# Patient Record
Sex: Female | Born: 1935 | Race: White | Hispanic: No | Marital: Married
Health system: Southern US, Community
[De-identification: ages and names within clinical notes are randomized; demographics above are authoritative.]

---

## 2004-08-02 ENCOUNTER — Ambulatory Visit: Payer: Self-pay | Admitting: Internal Medicine

## 2005-03-11 ENCOUNTER — Ambulatory Visit: Payer: Self-pay | Admitting: Internal Medicine

## 2006-02-20 ENCOUNTER — Ambulatory Visit: Payer: Self-pay | Admitting: Internal Medicine

## 2007-04-19 ENCOUNTER — Ambulatory Visit: Payer: Self-pay | Admitting: Family Medicine

## 2008-07-24 ENCOUNTER — Ambulatory Visit: Payer: Self-pay | Admitting: Internal Medicine

## 2009-03-04 ENCOUNTER — Ambulatory Visit: Payer: Self-pay | Admitting: Internal Medicine

## 2009-03-22 ENCOUNTER — Inpatient Hospital Stay: Payer: Self-pay | Admitting: Internal Medicine

## 2009-04-04 ENCOUNTER — Ambulatory Visit: Payer: Self-pay | Admitting: Internal Medicine

## 2009-10-27 ENCOUNTER — Observation Stay: Payer: Self-pay | Admitting: Internal Medicine

## 2010-01-07 ENCOUNTER — Ambulatory Visit: Payer: Self-pay | Admitting: Internal Medicine

## 2011-02-01 ENCOUNTER — Ambulatory Visit: Payer: Self-pay | Admitting: Internal Medicine

## 2011-11-14 ENCOUNTER — Ambulatory Visit: Payer: Self-pay | Admitting: Unknown Physician Specialty

## 2012-04-17 ENCOUNTER — Ambulatory Visit: Payer: Self-pay | Admitting: Internal Medicine

## 2012-05-28 ENCOUNTER — Ambulatory Visit: Payer: Self-pay | Admitting: Internal Medicine

## 2012-11-10 ENCOUNTER — Other Ambulatory Visit: Payer: Self-pay | Admitting: Unknown Physician Specialty

## 2012-11-10 LAB — CLOSTRIDIUM DIFFICILE BY PCR

## 2012-11-12 LAB — STOOL CULTURE

## 2012-11-22 ENCOUNTER — Other Ambulatory Visit: Payer: Self-pay | Admitting: Unknown Physician Specialty

## 2012-11-22 LAB — CLOSTRIDIUM DIFFICILE BY PCR

## 2012-11-25 LAB — STOOL CULTURE

## 2013-02-14 ENCOUNTER — Emergency Department: Payer: Self-pay | Admitting: Emergency Medicine

## 2013-02-14 LAB — COMPREHENSIVE METABOLIC PANEL
Albumin: 3.3 g/dL — ABNORMAL LOW (ref 3.4–5.0)
Bilirubin,Total: 0.4 mg/dL (ref 0.2–1.0)
Calcium, Total: 9.1 mg/dL (ref 8.5–10.1)
Creatinine: 0.89 mg/dL (ref 0.60–1.30)
EGFR (African American): >60
EGFR (Non-African Amer.): >60
Glucose: 125 mg/dL — ABNORMAL HIGH (ref 65–99)
SGOT(AST): 14 U/L — ABNORMAL LOW (ref 15–37)
Total Protein: 6.2 g/dL — ABNORMAL LOW (ref 6.4–8.2)

## 2013-02-14 LAB — CBC
HCT: 35.9 % (ref 35.0–47.0)
HGB: 12.3 g/dL (ref 12.0–16.0)
MCH: 30.2 pg (ref 26.0–34.0)
MCV: 88 fL (ref 80–100)

## 2013-02-14 LAB — CK TOTAL AND CKMB (NOT AT ARMC)
CK, Total: 53 U/L (ref 21–215)
CK-MB: 1.1 ng/mL (ref 0.5–3.6)

## 2013-02-14 LAB — TROPONIN I: Troponin-I: <0.02 ng/mL

## 2013-06-05 ENCOUNTER — Ambulatory Visit: Payer: Self-pay | Admitting: Unknown Physician Specialty

## 2013-06-10 ENCOUNTER — Ambulatory Visit: Payer: Self-pay | Admitting: Unknown Physician Specialty

## 2013-06-14 LAB — PATHOLOGY REPORT

## 2013-09-24 ENCOUNTER — Other Ambulatory Visit: Payer: Self-pay | Admitting: Unknown Physician Specialty

## 2013-09-24 LAB — CLOSTRIDIUM DIFFICILE(ARMC)

## 2013-09-26 LAB — STOOL CULTURE

## 2013-10-02 ENCOUNTER — Other Ambulatory Visit: Payer: Self-pay | Admitting: Unknown Physician Specialty

## 2013-10-02 LAB — CLOSTRIDIUM DIFFICILE(ARMC)

## 2013-10-04 LAB — WBCS, STOOL

## 2013-10-04 LAB — STOOL CULTURE

## 2013-10-10 ENCOUNTER — Ambulatory Visit: Payer: Self-pay | Admitting: Unknown Physician Specialty

## 2013-10-14 LAB — PATHOLOGY REPORT

## 2014-02-04 ENCOUNTER — Ambulatory Visit: Payer: Self-pay | Admitting: Neurology

## 2014-06-03 ENCOUNTER — Ambulatory Visit: Payer: Self-pay | Admitting: Internal Medicine

## 2014-06-13 ENCOUNTER — Ambulatory Visit: Payer: Self-pay | Admitting: Internal Medicine

## 2014-06-19 ENCOUNTER — Inpatient Hospital Stay: Payer: Self-pay | Admitting: Internal Medicine

## 2014-06-20 DIAGNOSIS — I441 Atrioventricular block, second degree: Secondary | ICD-10-CM

## 2014-06-24 ENCOUNTER — Encounter
Admit: 2014-06-24 | Disposition: A | Discharge status: Home / Self Care | Payer: Self-pay | Attending: Internal Medicine | Admitting: Internal Medicine

## 2014-06-24 ENCOUNTER — Ambulatory Visit: Admit: 2014-06-24 | Disposition: A | Discharge status: Home / Self Care | Payer: Self-pay | Admitting: Neurology

## 2014-06-26 LAB — CBC WITH DIFFERENTIAL/PLATELET
BASOS PCT: 0.2 %
Basophil #: 0 10*3/uL (ref 0.0–0.1)
EOS PCT: 1.4 %
Eosinophil #: 0.1 10*3/uL (ref 0.0–0.7)
HCT: 25.6 % — ABNORMAL LOW (ref 35.0–47.0)
HGB: 8.5 g/dL — AB (ref 12.0–16.0)
Lymphocyte #: 1 10*3/uL (ref 1.0–3.6)
Lymphocyte %: 12 %
MCH: 29.1 pg (ref 26.0–34.0)
MCHC: 33 g/dL (ref 32.0–36.0)
MCV: 88 fL (ref 80–100)
Monocyte #: 0.5 x10 3/mm (ref 0.2–0.9)
Monocyte %: 5.9 %
NEUTROS ABS: 6.7 10*3/uL — AB (ref 1.4–6.5)
Neutrophil %: 80.5 %
PLATELETS: 361 10*3/uL (ref 150–440)
RBC: 2.91 10*6/uL — ABNORMAL LOW (ref 3.80–5.20)
RDW: 14 % (ref 11.5–14.5)
WBC: 8.3 10*3/uL (ref 3.6–11.0)

## 2014-07-01 LAB — CBC WITH DIFFERENTIAL/PLATELET
Basophil #: 0 10*3/uL (ref 0.0–0.1)
Basophil %: 0.6 %
Eosinophil #: 0.1 10*3/uL (ref 0.0–0.7)
Eosinophil %: 1.5 %
HCT: 24.1 % — AB (ref 35.0–47.0)
HGB: 7.8 g/dL — ABNORMAL LOW (ref 12.0–16.0)
LYMPHS ABS: 1.2 10*3/uL (ref 1.0–3.6)
Lymphocyte %: 16.9 %
MCH: 28.9 pg (ref 26.0–34.0)
MCHC: 32.4 g/dL (ref 32.0–36.0)
MCV: 89 fL (ref 80–100)
MONO ABS: 0.7 x10 3/mm (ref 0.2–0.9)
Monocyte %: 10.2 %
NEUTROS ABS: 4.8 10*3/uL (ref 1.4–6.5)
Neutrophil %: 70.8 %
Platelet: 489 10*3/uL — ABNORMAL HIGH (ref 150–440)
RBC: 2.7 10*6/uL — ABNORMAL LOW (ref 3.80–5.20)
RDW: 14.5 % (ref 11.5–14.5)
WBC: 6.8 10*3/uL (ref 3.6–11.0)

## 2014-07-01 LAB — BASIC METABOLIC PANEL
Anion Gap: 6 — ABNORMAL LOW (ref 7–16)
BUN: 16 mg/dL
CHLORIDE: 101 mmol/L
CO2: 27 mmol/L
Calcium, Total: 8.5 mg/dL — ABNORMAL LOW
Creatinine: 0.58 mg/dL
EGFR (African American): >60
GLUCOSE: 100 mg/dL — AB
Potassium: 4.1 mmol/L
Sodium: 134 mmol/L — ABNORMAL LOW

## 2014-07-04 ENCOUNTER — Encounter
Admit: 2014-07-04 | Disposition: A | Discharge status: Home / Self Care | Payer: Self-pay | Attending: Internal Medicine | Admitting: Internal Medicine

## 2014-07-04 LAB — COMPREHENSIVE METABOLIC PANEL
ALBUMIN: 3.2 g/dL — AB
ALK PHOS: 129 U/L — AB
ALT: 20 U/L
Anion Gap: 8 (ref 7–16)
BUN: 22 mg/dL — ABNORMAL HIGH
Bilirubin,Total: 0.6 mg/dL
CO2: 26 mmol/L
CREATININE: 0.6 mg/dL
Calcium, Total: 9.2 mg/dL
Chloride: 101 mmol/L
Glucose: 104 mg/dL — ABNORMAL HIGH
POTASSIUM: 3.9 mmol/L
SGOT(AST): 20 U/L
SODIUM: 135 mmol/L
Total Protein: 6 g/dL — ABNORMAL LOW

## 2014-07-04 LAB — CBC WITH DIFFERENTIAL/PLATELET
BASOS PCT: 0.6 %
Basophil #: 0 10*3/uL (ref 0.0–0.1)
EOS PCT: 1.7 %
Eosinophil #: 0.1 10*3/uL (ref 0.0–0.7)
HCT: 28.9 % — ABNORMAL LOW (ref 35.0–47.0)
HGB: 9.3 g/dL — AB (ref 12.0–16.0)
LYMPHS PCT: 13 %
Lymphocyte #: 1 10*3/uL (ref 1.0–3.6)
MCH: 29 pg (ref 26.0–34.0)
MCHC: 32.2 g/dL (ref 32.0–36.0)
MCV: 90 fL (ref 80–100)
MONOS PCT: 8.2 %
Monocyte #: 0.7 x10 3/mm (ref 0.2–0.9)
NEUTROS PCT: 76.5 %
Neutrophil #: 6.1 10*3/uL (ref 1.4–6.5)
Platelet: 562 10*3/uL — ABNORMAL HIGH (ref 150–440)
RBC: 3.21 10*6/uL — AB (ref 3.80–5.20)
RDW: 15.2 % — ABNORMAL HIGH (ref 11.5–14.5)
WBC: 8 10*3/uL (ref 3.6–11.0)

## 2014-07-05 LAB — URINALYSIS, COMPLETE
BACTERIA: NONE SEEN
BLOOD: NEGATIVE
Bilirubin,UR: NEGATIVE
GLUCOSE, UR: NEGATIVE mg/dL (ref 0–75)
Ketone: NEGATIVE
Leukocyte Esterase: NEGATIVE
Nitrite: NEGATIVE
PROTEIN: NEGATIVE
Ph: 5 (ref 4.5–8.0)
RBC,UR: 1 /HPF (ref 0–5)
SPECIFIC GRAVITY: 1.014 (ref 1.003–1.030)
Squamous Epithelial: 1

## 2014-07-07 LAB — URINE CULTURE

## 2014-07-12 LAB — URINALYSIS, COMPLETE
BILIRUBIN, UR: NEGATIVE
Blood: NEGATIVE
Glucose,UR: NEGATIVE mg/dL (ref 0–75)
KETONE: NEGATIVE
Nitrite: POSITIVE
PROTEIN: NEGATIVE
Ph: 6 (ref 4.5–8.0)
SPECIFIC GRAVITY: 1.012 (ref 1.003–1.030)

## 2014-07-14 LAB — CBC WITH DIFFERENTIAL/PLATELET
BASOS ABS: 0 10*3/uL (ref 0.0–0.1)
Basophil %: 0.8 %
Eosinophil #: 0 10*3/uL (ref 0.0–0.7)
Eosinophil %: 0.9 %
HCT: 31.3 % — AB (ref 35.0–47.0)
HGB: 10.6 g/dL — ABNORMAL LOW (ref 12.0–16.0)
LYMPHS PCT: 21.6 %
Lymphocyte #: 1.2 10*3/uL (ref 1.0–3.6)
MCH: 30.3 pg (ref 26.0–34.0)
MCHC: 33.8 g/dL (ref 32.0–36.0)
MCV: 90 fL (ref 80–100)
Monocyte #: 0.7 x10 3/mm (ref 0.2–0.9)
Monocyte %: 12.5 %
Neutrophil #: 3.4 10*3/uL (ref 1.4–6.5)
Neutrophil %: 64.2 %
Platelet: 332 10*3/uL (ref 150–440)
RBC: 3.49 10*6/uL — ABNORMAL LOW (ref 3.80–5.20)
RDW: 15.5 % — ABNORMAL HIGH (ref 11.5–14.5)
WBC: 5.3 10*3/uL (ref 3.6–11.0)

## 2014-07-14 LAB — COMPREHENSIVE METABOLIC PANEL
AST: 20 U/L
Albumin: 3.4 g/dL — ABNORMAL LOW
Alkaline Phosphatase: 119 U/L
Anion Gap: 7 (ref 7–16)
BILIRUBIN TOTAL: 0.4 mg/dL
BUN: 19 mg/dL
CREATININE: 0.51 mg/dL
Calcium, Total: 9 mg/dL
Chloride: 103 mmol/L
Co2: 25 mmol/L
EGFR (Non-African Amer.): >60
GLUCOSE: 108 mg/dL — AB
Potassium: 2.8 mmol/L — ABNORMAL LOW
SGPT (ALT): 16 U/L
SODIUM: 135 mmol/L
TOTAL PROTEIN: 5.9 g/dL — AB

## 2014-07-14 LAB — URINE CULTURE

## 2014-07-14 LAB — MAGNESIUM: MAGNESIUM: 1.6 mg/dL — AB

## 2014-07-14 LAB — TSH: Thyroid Stimulating Horm: 2.039 u[IU]/mL

## 2014-07-18 LAB — COMPREHENSIVE METABOLIC PANEL
ALK PHOS: 109 U/L
ANION GAP: 3 — AB (ref 7–16)
Albumin: 3.7 g/dL
BILIRUBIN TOTAL: 0.5 mg/dL
BUN: 17 mg/dL
CREATININE: 0.53 mg/dL
Calcium, Total: 9.4 mg/dL
Chloride: 110 mmol/L
Co2: 25 mmol/L
EGFR (African American): >60
EGFR (Non-African Amer.): >60
GLUCOSE: 99 mg/dL
Potassium: 4.3 mmol/L
SGOT(AST): 25 U/L
SGPT (ALT): 19 U/L
SODIUM: 138 mmol/L
TOTAL PROTEIN: 6.2 g/dL — AB

## 2014-07-18 LAB — CBC WITH DIFFERENTIAL/PLATELET
BASOS ABS: 0 10*3/uL (ref 0.0–0.1)
Basophil %: 0.4 %
Eosinophil #: 0 10*3/uL (ref 0.0–0.7)
Eosinophil %: 0.4 %
HCT: 32.8 % — ABNORMAL LOW (ref 35.0–47.0)
HGB: 10.8 g/dL — AB (ref 12.0–16.0)
LYMPHS PCT: 16.2 %
Lymphocyte #: 1.1 10*3/uL (ref 1.0–3.6)
MCH: 29.8 pg (ref 26.0–34.0)
MCHC: 32.8 g/dL (ref 32.0–36.0)
MCV: 91 fL (ref 80–100)
Monocyte #: 0.6 x10 3/mm (ref 0.2–0.9)
Monocyte %: 8.6 %
NEUTROS PCT: 74.4 %
Neutrophil #: 5 10*3/uL (ref 1.4–6.5)
Platelet: 306 10*3/uL (ref 150–440)
RBC: 3.61 10*6/uL — ABNORMAL LOW (ref 3.80–5.20)
RDW: 15.9 % — AB (ref 11.5–14.5)
WBC: 6.7 10*3/uL (ref 3.6–11.0)

## 2014-07-21 LAB — COMPREHENSIVE METABOLIC PANEL
ALBUMIN: 3 g/dL — AB
ANION GAP: 6 — AB (ref 7–16)
Alkaline Phosphatase: 85 U/L
BUN: 13 mg/dL
Bilirubin,Total: 0.4 mg/dL
Calcium, Total: 9 mg/dL
Chloride: 109 mmol/L
Co2: 26 mmol/L
Creatinine: 0.42 mg/dL — ABNORMAL LOW
EGFR (African American): >60
EGFR (Non-African Amer.): >60
Glucose: 91 mg/dL
POTASSIUM: 3.4 mmol/L — AB
SGOT(AST): 17 U/L
SGPT (ALT): 14 U/L
Sodium: 141 mmol/L
TOTAL PROTEIN: 5.2 g/dL — AB

## 2014-07-21 LAB — CBC WITH DIFFERENTIAL/PLATELET
Basophil #: 0 10*3/uL (ref 0.0–0.1)
Basophil %: 0.7 %
Eosinophil #: 0.2 10*3/uL (ref 0.0–0.7)
Eosinophil %: 3.1 %
HCT: 31.3 % — ABNORMAL LOW (ref 35.0–47.0)
HGB: 10.5 g/dL — AB (ref 12.0–16.0)
LYMPHS ABS: 1.6 10*3/uL (ref 1.0–3.6)
Lymphocyte %: 22.7 %
MCH: 29.9 pg (ref 26.0–34.0)
MCHC: 33.4 g/dL (ref 32.0–36.0)
MCV: 90 fL (ref 80–100)
MONOS PCT: 8.8 %
Monocyte #: 0.6 x10 3/mm (ref 0.2–0.9)
NEUTROS ABS: 4.5 10*3/uL (ref 1.4–6.5)
Neutrophil %: 64.7 %
Platelet: 258 10*3/uL (ref 150–440)
RBC: 3.5 10*6/uL — ABNORMAL LOW (ref 3.80–5.20)
RDW: 15.5 % — ABNORMAL HIGH (ref 11.5–14.5)
WBC: 6.9 10*3/uL (ref 3.6–11.0)

## 2014-08-03 ENCOUNTER — Encounter
Admission: RE | Admit: 2014-08-03 | Discharge: 2014-08-03 | Disposition: A | Discharge status: Home / Self Care | Payer: 59 | Source: Ambulatory Visit | Attending: Internal Medicine | Admitting: Internal Medicine

## 2014-08-03 NOTE — Consult Note (Signed)
PATIENT NAME:  Julie Wiley, Julie Wiley MR#:  [id number] DATE OF BIRTH:  May 19, 1932  DATE OF CONSULTATION:  06/23/2014  CONSULTING PHYSICIAN: Audery Amel, MD  IDENTIFYING INFORMATION AND REASON FOR CONSULTATION: A 81 year old woman currently in the hospital after a leg fracture. Consult for depression and weight loss.   HISTORY OF PRESENT ILLNESS: Information from the patient and the patient's family and the chart. This is reconsult. The patient was seen by Dr. Guss Bunde over the weekend. Family requested a repeat evaluation. This 81 year old woman is currently in the hospital after suffering a leg fracture from a fall. Family reports psychiatrically that she has had gradual worsening of her cognition that has taken place over months. She has been diagnosed with dementia and treated with dementia-retarding agents outside the hospital. She has a history of being diagnosed with depression as well and has been treated with antidepressants by her primary care doctor. Family is chiefly concerned, however, about her major weight loss. The patient is able to describe that she has lost between 70-80 pounds over the past several months. She said that she just does not want to eat anymore. Family reports that she has been gradually eating smaller and smaller volumes. She says that she sleeps poorly at night. She admits that her mood feels bad much of the time. She says that she occasionally has thoughts about wanting to just "give up," but denies that she would ever do anything to try and harm herself. Asked about hallucinations, she talks about a bad dream, but does not report any clear hallucinations during the day. The patient indicates that she feels like a major stress in her life is discord within her family which she does not go into detail about. She tells me that thing that she is most looking forward to in life is that there would be peace in her family, which again she does not go into detail about.   PAST  PSYCHIATRIC HISTORY: No history of suicide attempts. No history of psychiatric hospitalization. Has been treated with Zoloft by her primary care doctor, originally at 50 mg, now at 100. Family knows of no other antidepressant use in the past.   SOCIAL HISTORY: Lives with her husband. Sounds like he does most of the monitoring of her medication. Has extended family in the area who are involved in her life.   PAST MEDICAL HISTORY: The patient currently in the hospital because of a fall. History of high blood pressure. History of major weight loss.   FAMILY HISTORY: Father committed suicide.   CURRENT MEDICATIONS: Here in the hospital getting Levaquin IV for infection. Zoloft 50 mg a day now. Dr. Katrinka Blazing started her on Sinemet 25 mg 3 times a day. Risperdal 0.5 mg twice a day, which is currently being held, aspirin 81 mg a day, Percocet 5 mg 1-2 every 6 hours as needed for pain, trazodone 25 mg at night, pantoprazole 40 mg a day, mirabegron extended release 25 mg once a day, Namenda 5 mg daily, Cozaar 50 mg daily, amlodipine 5 mg twice a day.   ALLERGIES: CODEINE.   REVIEW OF SYSTEMS: The patient continues to report no appetite. Down, negative mood. No suicidal ideation actively. Passive hopelessness.   MENTAL STATUS EXAMINATION: Chronic and acutely ill woman interviewed in a hospital room. Eye contact was poor and minimal. Psychomotor activity very slow. Speech was slow and decreased in amount and quiet. Affect flat at times, dysphoric and sad, almost tearful. Mood stated as being not so good.  Thoughts are a little bit scattered and slow. Denies hallucinations. Denies suicidal intent, but has some hopelessness. The patient can repeat 3 words immediately with some effort, remembers none of them at 3 minutes. She was disoriented as to her current situation [place]. Judgment and insight impaired, cognitive processing impaired.   LABORATORY RESULTS AND  IMAGING: Brain MRI today was read as nearly normal. No acute finding. The patient is anemic with a hemoglobin and hematocrit both very low. Magnesium normal. Sodium low. Glucose has been elevated. Multiple laboratories done over the last couple days.   VITAL SIGNS: Blood pressure 103/63, respirations 18, pulse 48, temperature 99.   ASSESSMENT: A 81 year old woman who is malnourished. Has been losing a large amount of weight. So far, no medical cause other than lack of appetite. The patient denies suicidal intent, but has multiple signs and symptoms consistent with major depression. Without at all disputing the fact that she has some dementia, I think this patient has a major depression as well. Zoloft has clearly not been enough to treat it.  TREATMENT PLAN: The case discussed with the family and the patient. I proposed switching the Zoloft over to a different antidepressant. The husband is quite adamant that he does not want her to take anything that could be sedating for her. He tells a story about how her trazodone dose, even at a small dose of 50 mg, was terribly oversedating for her. Therefore, I will not recommend Remeron, but instead recommend a switch over to Lexapro, cut the dose of Zoloft in half, start 5 mg a day of Lexapro. The patient will need psychiatric followup after discharge. I am concerned about the possibility that she will continue to starve herself gradually and worsen her health.   DIAGNOSIS, PRINCIPAL AND PRIMARY:  AXIS I: Major depression, severe, recurrent.   SECONDARY DIAGNOSES: AXIS I: Dementia, mixed type, possibly frontal-type.  AXIS II: Deferred.  AXIS III: Acute fracture, malnutrition, now being diagnosed and treated for parkinsonism.     ____________________________ Audery AmelJohn T. Luke Rigsbee, MD jtc:LT D: 06/23/2014 19:49:46 ET T: 06/23/2014 20:25:16 ET JOB#: [id number]  cc: Audery AmelJohn T. Cloyde Oregel, MD, <Dictator> Audery AmelJOHN T Jenaveve Fenstermaker MD ELECTRONICALLY SIGNED 07/07/2014 9:57

## 2014-08-03 NOTE — Consult Note (Signed)
Brief Consult Note: Diagnosis: Non displaced left lateral tibial plateau and fibular neck fractures.   Patient was seen by consultant.   Recommend further assessment or treatment.   Orders entered.   Comments: 81 year old female fell in garage last night injuring the left knee and leg.  Brought to Emergency Room where exam and X-rays show non displaced fractures of the lateral tibial plateau and fibular neck.  Very osteoporotic as well.  History of cabg and 83 lb weight loss in last year.  Admitted for pain control and mobilization.    Exam:  Sleepy but arousable.  circulation/sensation/motor function good both lower legs with some bruising. Left knee swollen but not red or warm.  range of motion decreased by pain and swelling.  some increased valgus due to severe arthritis.  Hip range of motion good.   X-rays: as above.  Hip and femur negative as is left elbow and ankle. No tibial shaft fracture noted. Marked osteopenia.   Imp:  as above  Rx: Ace, ice, knee immobilizer.       touch down weight bearing only       Will need skilled nursing facility        No surgery contemplated as fractures are non displaced.  Electronic Signatures: Valinda HoarMiller, Angellee Cohill E (MD)  (Signed 18-Mar-16 13:55)  Authored: Brief Consult Note   Last Updated: 18-Mar-16 13:55 by Valinda HoarMiller, Aniceto Kyser E (MD)

## 2014-08-03 NOTE — Consult Note (Signed)
Chief Complaint:  Subjective/Chief Complaint Lethargic and sleepy this morning from a night after relative insomnia confusion and delirium denies any pain.   VITAL SIGNS/ANCILLARY NOTES: **Vital Signs.:   21-Mar-16 07:42  Vital Signs Type Routine  Temperature Temperature (F) 98  Celsius 36.6  Temperature Source oral  Pulse Pulse 50  Respirations Respirations 19  Systolic BP Systolic BP 144  Diastolic BP (mmHg) Diastolic BP (mmHg) 67  Mean BP 95  Pulse Ox % Pulse Ox % 95  Pulse Ox Activity Level  At rest  Oxygen Delivery Room Air/ 21 %  *Intake and Output.:   Daily 21-Mar-16 07:00  Grand Totals Intake:  1334 Output:      Net:  1334 24 Hr.:  8185  Oral Intake      In:  240  IV (Primary)      In:  1094  Urine ml     Out:  0  Length of Stay Totals Intake:  2454 Output:      Net:  6314   Brief Assessment:  GEN well developed, well nourished, no acute distress   Cardiac Regular  murmur present  -- LE edema  -- JVD  --Gallop   Respiratory normal resp effort  clear BS   Gastrointestinal Normal   Gastrointestinal details normal Soft  Nontender  Nondistended   EXTR negative cyanosis/clubbing, negative edema, knee in traction   Lab Results: Routine Chem:  21-Mar-16 03:41   Glucose, Serum  105 (65-99 NOTE: New Reference Range  05/27/14)  BUN 11 (6-20 NOTE: New Reference Range  05/27/14)  Creatinine (comp) 0.50 (0.44-1.00 NOTE: New Reference Range  05/27/14)  Sodium, Serum  129 (135-145 NOTE: New Reference Range  05/27/14)  Potassium, Serum 3.6 (3.5-5.1 NOTE: New Reference Range  05/27/14)  Chloride, Serum  97 (101-111 NOTE: New Reference Range  05/27/14)  CO2, Serum 25 (22-32 NOTE: New Reference Range  05/27/14)  Calcium (Total), Serum  8.0 (8.9-10.3 NOTE: New Reference Range  05/27/14)  Anion Gap 7  eGFR (African American) >60  eGFR (Non-African American) >60 (eGFR values <64m/min/1.73 m2 may be an indication of chronic kidney disease  (CKD). Calculated eGFR is useful in patients with stable renal function. The eGFR calculation will not be reliable in acutely ill patients when serum creatinine is changing rapidly. It is not useful in patients on dialysis. The eGFR calculation may not be applicable to patients at the low and high extremes of body sizes, pregnant women, and vegetarians.)  Routine Hem:  21-Mar-16 03:41   WBC (CBC) 9.6  RBC (CBC)  2.88  Hemoglobin (CBC)  8.5  Hematocrit (CBC)  25.2  Platelet Count (CBC) 228  MCV 88  MCH 29.3  MCHC 33.5  RDW 13.8  Neutrophil % 84.5  Lymphocyte % 5.9  Monocyte % 9.4  Eosinophil % 0.1  Basophil % 0.1  Neutrophil #  8.1  Lymphocyte #  0.6  Monocyte # 0.9  Eosinophil # 0.0  Basophil # 0.0 (Result(s) reported on 23 Jun 2014 at 04:48AM.)   Radiology Results: XRay:    17-Mar-16 20:45, Femur Left  Femur Left   REASON FOR EXAM:    fall  COMMENTS:       PROCEDURE: DXR - DXR FEMUR LEFT  - Jun 19 2014  8:45PM     CLINICAL DATA:  Left leg pain following fall, initial encounter    EXAM:  LEFT FEMUR - 2 VIEW    COMPARISON:  None.    FINDINGS:  There fractures  noted in the proximal tibia and fibula which are  incompletely evaluated on this exam. The femur appears intact. The  visualized portions of the pelvic ring are unremarkable. A large  knee joint effusion is noted with fat fluid level consistent with  thefractures in the proximal tibia.     IMPRESSION:  No acute femoral fracture is noted. There fractures of the proximal  tibia and fibula with an associated joint effusion of the knee with  a fat fluid level.      Electronically Signed    By: Inez Catalina M.D.    On: 06/19/2014 20:51       Verified By: Everlene Farrier, M.D.,    17-Mar-16 21:00, Ankle Left Complete  Ankle Left Complete   REASON FOR EXAM:    fall  COMMENTS:       PROCEDURE: DXR - DXR ANKLE LEFT COMPLETE  - Jun 19 2014  9:00PM     CLINICAL DATA:  Left leg pain status post  fall.    EXAM:  LEFT ANKLE COMPLETE - 3+ VIEW    COMPARISON:  None.    FINDINGS:  There is generalized osteopenia. There is no evidence of fracture,  dislocation, or joint effusion. There is no evidence of arthropathy  or other focal bone abnormality. Soft tissues are unremarkable.     IMPRESSION:  No acute osseous injury of the left ankle.      Electronically Signed    By: Kathreen Devoid    On: 06/19/2014 21:02         Verified By: Jennette Banker, M.D., MD    17-Mar-16 21:00, Elbow Right Complete  Elbow Right Complete   REASON FOR EXAM:    fall  COMMENTS:       PROCEDURE: DXR - DXR ELBOW RT COMP W/OBLIQUES  - Jun 19 2014  9:00PM     CLINICAL DATA:  Left leg pain status post fall. Right elbow pain  status post fall.    EXAM:  RIGHT ELBOW - COMPLETE 3+ VIEW    COMPARISON:  None.    FINDINGS:  There is generalized osteopenia. There is no evidence of fracture,  dislocation, or joint effusion. There is no evidence of arthropathy  or other focal bone abnormality. Soft tissues are unremarkable.     IMPRESSION:  No acute osseous injury of the right elbow.      Electronically Signed    By: Kathreen Devoid    On: 06/19/2014 21:05         Verified By: Jennette Banker, M.D., MD    17-Mar-16 21:00, Pelvis AP Only  Pelvis AP Only   REASON FOR EXAM:    fall, left leg pain  COMMENTS:       PROCEDURE: DXR - DXR PELVIS AP ONLY  - Jun 19 2014  9:00PM     CLINICAL DATA:  Left leg pain status post fall.  Initial encounter.    EXAM:  PELVIS - 1-2 VIEW    COMPARISON:  None.    FINDINGS:  There is no evidence of fracture or dislocation. Both femoral heads  are seated normally within their respective acetabula. Mild  degenerative change is noted along the lower lumbar spine. The  patient is status post vertebroplasty at L2. The sacroiliac joints  are unremarkable in appearance.    The visualized bowel gas pattern is grossly unremarkable in  appearance. Scattered  diverticulosis is noted along the proximal  sigmoid colon. A clip is seen  overlying the left inguinal region.     IMPRESSION:  No evidence of fracture or dislocation.      Electronically Signed    By: Garald Balding M.D.    On: 06/19/2014 21:11     Verified By: JEFFREY . CHANG, M.D.,    17-Mar-16 21:00, Tibia And Fibula Left  Tibia And Fibula Left   REASON FOR EXAM:    fall  COMMENTS:       PROCEDURE: DXR - DXR TIBIA AND FIBULA LT (LOWER L  - Jun 19 2014  9:00PM     CLINICAL DATA:  Left leg pain status post fall.    EXAM:  LEFT TIBIA AND FIBULA - 2 VIEW    COMPARISON:  None.    FINDINGS:  Nondisplaced proximal fibular metaphysis fracture. Nondisplaced  lateral tibial plateau fracture with a fracture cleft extending into  the proximal diaphysis. Large knee joint effusion. Possible  nondisplaced fracture of the inferior pole of the patella.     IMPRESSION:  1.  Nondisplaced proximal fibular metaphysis fracture.  2. Nondisplaced lateral tibial plateau fracture with a fracture  cleft extending into the proximal diaphysis.  3. Possible nondisplaced fracture of the inferior pole of the  patella.  4. Large knee joint effusion.      Electronically Signed    By: Kathreen Devoid    On: 06/19/2014 21:07     Verified By: Jennette Banker, M.D., MD    19-Mar-16 23:13, Chest PA and Lateral  Chest PA and Lateral   REASON FOR EXAM:    AMS  COMMENTS:       PROCEDURE: DXR - DXR CHEST PA (OR AP) AND LATERAL  - Jun 21 2014 11:13PM     CLINICAL DATA:  Altered mental status.    EXAM:  CHEST  2 VIEW    COMPARISON:  10/27/2009    FINDINGS:  There is posterior left lower lobe consolidation. The right lung is  clear. There is moderate unchanged cardiomegaly and mild aortic  tortuosity. Pulmonary vasculature is normal.     IMPRESSION:  Left lower lobe consolidation, infectious or atelectatic. Unchanged  cardiomegaly.      Electronically Signed    By: Andreas Newport M.D.     On: 06/21/2014 23:16         Verified By: Andreas Newport, M.D.,  Cardiology:    17-Mar-16 19:33, ED ECG  Ventricular Rate 54  Atrial Rate 104  P-R Interval 190  QRS Duration 126  QT 494  QTc 468  P Axis 80  R Axis 29  T Axis 10  ECG interpretation   Sinus tachycardia with 2nd degree A-V block (Mobitz II) with 2:1 A-V conduction  Right bundle branch block  Abnormal ECG  When compared with ECG of 10-Oct-2013 17:10,  Sinus rhythm is now with 2nd degree A-V block (Mobitz II)  ----------unconfirmed----------  Confirmed by OVERREAD, NOT ([id number]), editor PEARSON, BARBARA ([id number]) on 06/20/2014 1:23:02 PM  ED ECG     18-Mar-16 09:01, ECG  Ventricular Rate 51  Atrial Rate 51  P-R Interval 172  QRS Duration 124  QT 572  QTc 527  P Axis 82  R Axis 41  T Axis 47  ECG interpretation   Sinus bradycardia  Right bundle branch block  Abnormal ECG  When compared with ECG of 19-Jun-2014 19:33,  No significant change was found  Confirmed by Rockey Situ, TIMOTHY ([id number]) on 06/20/2014 2:43:55 PM    Overreader: Ida Rogue  ECG  18-Mar-16 10:28, Echo Doppler  Echo Doppler   PRELIMINARY REPORT    The following is a PRELIMINARY Radiology report.  A final report will follow pending radiologist verification.      REASON FOR EXAM:      COMMENTS:       PROCEDURE: [facility] - ECHO DOPPLER COMPLETE(TRANSTHOR)  - Jun 20 2014 10:28AM     RESULT: Echocardiogram Report    Patient Name:   MERAL GEISSINGER Date of Exam: 06/20/2014  Medical Rec #:  [id number]             Custom1:  Date of Birth:  14-Nov-1932          Height:       64.0 in  Patient Age:    81 years           Weight:       111.0 lb  Patient Gender: F                  BSA:          1.52 m??    Indications: Syncope,pre-op clearance  Sonographer:    Sherrie Sport RDCS  Referring Phys: Lance Coon, F    Summary:   1. Left ventricular ejection fraction, by visual estimation, is 70 to   75%.   2. Normal global left ventricular systolic  function.   3. Mildly elevated pulmonary artery systolic pressure.   4. Mild to moderate tricuspid regurgitation.  2D AND M-MODE MEASUREMENTS (normal ranges within parentheses):  Left Ventricle:          Normal  IVSd (2D):      0.74 cm (0.7-1.1)  LVPWd (2D):     1.07 cm (0.7-1.1) Aorta/LA:                  Normal  LVIDd (2D):     4.72 cm (3.4-5.7) Aortic Root (2D): 3.60 cm (2.4-3.7)  LVIDs (2D):     2.66 cm        Left Atrium (2D): 4.20 cm (1.9-4.0)  LV FS (2D):     43.6 %   (>25%)  LV EF (2D):     74.8 %   (>50%)                                    Right Ventricle:                                    RVd (2D):        4.85 cm  LV DIASTOLIC FUNCTION:  MV Peak E: 1.17 m/s E/e' Ratio: 4.90  MV Peak A: 0.56 m/s Decel Time: 144 msec  E/A Ratio: 2.10  SPECTRAL DOPPLER ANALYSIS (where applicable):  Mitral Valve:  MV P1/2 Time: 41.76 msec  MV Area, PHT: 5.27 cm??  Aortic Valve: AoV Max Vel: 1.47 m/s AoV Peak PG: 8.7 mmHg AoV Mean PG:  LVOT Vmax: 1.16 m/s LVOT VTI:  LVOT Diameter: 2.00 cm  AoV Area, Vmax: 2.47 cm?? AoV Area, VTI:  AoV Area, Vmn:  Tricuspid Valve and PA/RV Systolic Pressure: TR Max Velocity: 2.97 m/s RA     Pressure: 5 mmHg RVSP/PASP: 40.3 mmHg  Pulmonic Valve:  PV Max Velocity: 1.28 m/s PV Max PG: 6.6 mmHg PV Mean PG:    PHYSICIAN INTERPRETATION:  Left Ventricle: The  left ventricular internal cavity size was normal. LV   septal wall thickness was normal. LV posterior wall thickness was normal.   No left ventricular hypertrophy. Global LV systolic function was normal.   Left ventricular ejection fraction, by visual estimation, is 70 to 75%.  Right Ventricle: The right ventricular size is normal. Global RV systolic   function is normal.  Left Atrium: The left atrium is normal in size.  Right Atrium: The right atrium is normal in size.  Pericardium: There is no evidence of pericardial effusion.  Mitral Valve: The mitral valve is normal in structure. Trace mitral valve      regurgitation is seen.  Tricuspid Valve: The tricuspid valve is normal. Mild to moderate   tricuspid regurgitation is visualized. The tricuspid regurgitant velocity   is 2.97 m/s, and with an assumed right atrial pressure of 5 mmHg, the   estimated right ventricular systolic pressure is mildly elevated at 40.3   mmHg.  Aortic Valve: The aortic valve is normal. No evidence of aortic valve   regurgitation is seen.  Pulmonic Valve: The pulmonic valve is normal.    [facility]  Electronically signed by [place] MD  Signature Date/Time: 06/20/2014/1:06:06 PM    *** Final ***  IMPRESSION: .        Dictated By: Ralene Cork . DEFAULT, M.D.,  CT:    17-Mar-16 20:00, CT Head Without Contrast  CT Head Without Contrast   REASON FOR EXAM:    fall to head  COMMENTS:       PROCEDURE: CT  - CT HEAD WITHOUT CONTRAST  - Jun 19 2014  8:00PM     CLINICAL DATA:  Status post fall. Hit the back of the head.  Headaches. Blurry vision.    EXAM:  CT HEAD WITHOUT CONTRAST    TECHNIQUE:  Contiguous axial images were obtained from the base of the skull  through the vertex without intravenous contrast.  COMPARISON:  MR brain 02/04/2014, CT brain 02/14/2013    FINDINGS:  There is no evidence of mass effect, midline shift, or extra-axial  fluid collections. There is no evidence of a space-occupying lesion  or intracranial hemorrhage. There is no evidence of a cortical-based  area of acute infarction. There is generalized cerebral atrophy.  There is periventricular white matter low attenuation likely  secondary to microangiopathy.    The ventricles and sulci are appropriate for the patient's age. The  basal cisterns are patent.    Visualized portions of the orbits are unremarkable. The visualized  portions of the paranasal sinuses and mastoid air cells are  unremarkable. Cerebrovascular atherosclerotic calcifications are  noted.    The osseous structures are unremarkable.      IMPRESSION:  1. No acute intracranial pathology.  2. Chronic microvascular disease and cerebral atrophy.      Electronically Signed    By: Kathreen Devoid    On: 06/19/2014 20:06       Verified By: Jennette Banker, M.D., MD    19-Mar-16 23:04, CT Head Without Contrast  CT Head Without Contrast   REASON FOR EXAM:    AMS  COMMENTS:       PROCEDURE: CT  - CT HEAD WITHOUT CONTRAST  - Jun 21 2014 11:04PM     CLINICAL DATA:  Recent fall, decline in mental status.  Hallucinations.    EXAM:  CT HEAD WITHOUT CONTRAST    TECHNIQUE:  Contiguous axial images were obtained from the base of the skull  through  the vertex without intravenous contrast.  COMPARISON:  06/19/2014    FINDINGS:  Mild prominence of the sulci, cisterns, ventricles, in keeping with  cerebral and cerebellar volume loss. Mild periventricular and  subcortical white matter hypodensities, a nonspecific finding often  seen in the setting of chronic microangiopathic change. No definite  CT evidence of an acute infarction. No intraparenchymal hemorrhage,  mass, mass effect, or abnormal extra-axial fluid collection. The  visualized paranasal sinuses and mastoid air cells are predominantly  clear     IMPRESSION:  Volume loss and white matter changes as above. No CT evidence of an  acute intracranial abnormality.  Electronically Signed    By: Carlos Levering M.D.    On: 06/21/2014 23:21         Verified By: Tommi Rumps, M.D.,   Assessment/Plan:  Assessment/Plan:  Assessment IMP  delirium   weakness fatigue  bradycardia  possible second-degree heart block  vertigo  dizzy  knee fracture  confusion  recent fall  coronary artery disease  hypertension  GERD  dementia  hyperlipidemia .   Plan continue antibiotics for pneumonia  consider permanent pacemaker placement if bradycardia does not improve Hold metoprolol indefinitely  continue Protonix for GERD  symptoms  agree with orthopedic  treatment for rehab  pain control with morphine  reduce narcotics because of confusion  blood pressure control with losartan  consider adding amlodipine if additional blood pressure support as needed  continue dementia therapy  no clear indication for permanent pacemaker provided metoprolol was discontinued  DVT prophylaxis  follow-up with Cardiology as an outpatient   Electronic Signatures: Lujean Amel D (MD)  (Signed 21-Mar-16 12:35)  Authored: Chief Complaint, VITAL SIGNS/ANCILLARY NOTES, Brief Assessment, Lab Results, Radiology Results, Assessment/Plan   Last Updated: 21-Mar-16 12:35 by Lujean Amel D (MD)

## 2014-08-03 NOTE — Consult Note (Signed)
PATIENT NAME:  Julie LevySOARES, Kyllie C MR#:  [id number] DATE OF BIRTH:  1932-06-20  DATE OF CONSULTATION:  06/21/2014  REFERRING PHYSICIAN:   CONSULTING PHYSICIAN:  Ardenia Stiner K. Ata Pecha, MD  SUBJECTIVE: The patient was seen in consultation in room 145 at [facility] with the patient's 2nd husband of 28 years and a daughter and son-in-law. Most of the information was given by the family, which is very reliable. According to the information obtained from the husband, with whom the patient lives, the patient has lost 79 pounds in a period of 1 year, and she has been feeling depressed. She was evaluated by Dr. Hale BogusPorter in neurology at [facility] about 3 years ago and was diagnosed with dementia. Currently, she is on Namenda 5 mg twice a day. In addition, the patient has been on Zoloft for her depression. The patient had a fall recently, when she hit her head on the concrete and CT scans were done, which were all negative. In addition, she had a fracture of her tibial bone for which she is being [facility]. According to information obtained, the patient complains of nausea every morning and not been heating well, which is the cause of her losing 79 pounds in a period of 1 year. Husband does everything around the house, as the patient is forgetful, although she can recognize her husband and calls him by the name and recognized her daughter and son-in-law. However, she stays confused and she does not know the day and date and is frustrated about her confusion.   PAST PSYCHIATRIC HISTORY: No previous history of inpatient hold on psychiatry. No history of suicide attempts. Not been following a psychiatrist. The patient is on Zoloft, which is an antidepressant for her neurologist, Dr. Hale BogusPorter. He was following her for her dementia at [facility]. Alcohol and drugs are denied.   MENTAL STATUS: The patient was seen lying in bed. Alert, but really confused. Her husband reports that she calls him by his name. She calls her  daughter and son-in-law by their names. She does not know that she had a fall. She does not know that she is in a hospital, does not know day and date or time. Gets irritable at times and gets frustrated. She has to be fed and her daughter had been doing it during the interview. According to information obtained from the family, the patient has been hallucinating and seeing things that are not around and hearing voices of people that are not around for the past 24 hours or more. The family reports that she never had such problems before. Insight and judgment: Impaired. Cognition: Impaired. Impulse control: Poor.   IMPRESSION: Dementia with behavioral disturbances and probably depression and psychosis.   RECOMMENDATIONS: Recommend consultation by Dr. Hale BogusPorter for evaluation of her dementia and medications and probably increasing her Namenda. Requested the nurse to add Risperdal 0.5 mg p.o. twice a day to help her with her hallucinations and watch.     ____________________________ Jannet MantisSurya K. Guss [place]unde[place]alla, MD skc:mw D: 06/21/2014 17:27:07 ET T: 06/21/2014 18:16:05 ET JOB#: [id number]  cc: Monika SalkSurya K. Guss [place]unde[place], MD, <Dictator> Beau FannySURYA K Yeraldy Spike MD ELECTRONICALLY SIGNED 06/22/2014 11:18

## 2014-08-03 NOTE — Discharge Summary (Signed)
PATIENT NAME:  Julie Wiley, Joell C MR#:  [id number] DATE OF BIRTH:  04-28-1932  DATE OF ADMISSION:  06/19/2014 DATE OF DISCHARGE:  06/24/2014  DISCHARGE DIAGNOSES: 1.  Healthcare-associated pneumonia.  2.  Left tibia-fibula fracture.  3.  Severe depression. 4.  Dementia. 5.  Sinus bradycardia.   PROCEDURES: None.   CONSULTATIONS: Neurology, Dr. Loretha BrasilZeylikman; orthopedics, Dr. Hyacinth MeekerMiller; cardiology, Dr. Juliann Paresallwood; psychiatry, Dr. Toni Amendlapacs.   CODE STATUS: DO NOT RESUSCITATE.   DISCHARGE MEDICATIONS:  1.  Vitamin D2 50,000 international units 1 capsule p.o. once a week on Wednesdays. 2.  Aspirin 81 mg p.o. once daily. 3.  Budesonide 3 mg 1 capsule p.o. every other day. 4.  Pantoprazole 20 mg 1 tablet p.o. once daily in the a.m. 5.  Zofran 4 mg 1 tablet p.o. once daily in the a.m. as needed for nausea and vomiting. 6.  Amlodipine 5 mg 1 tablet p.o. 2 times a day. 7.  Losartan 50 mg p.o. daily in a.m. 8.  Ocuvite 1 tablet p.o. 2 times a day. 9.  Multivitamin 1 tablet p.o. once daily. 10.  Namenda 5 mg 1 tablet p.o. once daily in the evening. 11.  Trazodone 50 mg half tablet p.o. once daily at bedtime. 12.  Myrbetriq 25 mg 1 tablet p.o. once daily at bedtime. 13.  Zoloft 50 mg 1 tablet p.o. once daily. 14.  Tylenol 325 mg 2 tablets p.o. every 6 hours as needed for mild pain or temperature greater than 100.4. 15.  Percocet 5/325 one tablet p.o. every 6 hours as needed for moderate to severe pain. 16.  Escitalopram 5 mg 1 tablet p.o. once daily. 17.  Ensure Plus 1 can p.o. 3 times a day with meals. 18.  Levofloxacin 750 mg 1 tablet every 48 hours for 5 more doses then stop. 19.  Florastor 250 mg 1 capsule p.o. 2 times a day for 10 days. 20.  Colace 100 mg 1 capsule p.o. 2 times a day as needed for constipation. 21.  Heparin 5000 units subcutaneous every 12 hours for a week and then repeat CBC and further management by the primary care physician depending on the CBC results.   NURSING CARE: Continue  immobilizer to the left lower extremity. Followup care as needed. Touch-down weight-bearing and follow up with physical therapy.   DISCHARGE ACTIVITY: As recommended by PT with touch-down weightbearing.   DISCHARGE DIET: Low diet with regular consistency.   DISCHARGE FOLLOWUP: Follow-up appointments with orthopedics with Dr. Hyacinth MeekerMiller in 2 weeks, primary care physician in 1 week, repeat CBC in 1 week for further heparin dosing, heparin ordered based on the CBC results, in 2 to 4 weeks with psychiatry, outpatient follow up with neurology, Dr. Malvin JohnsPotter, in 2 weeks, and cardiology, Dr. Juliann Paresallwood, in 2 weeks.  BRIEF HISTORY AND PHYSICAL: The patient is a 81 year old Caucasian female who came into the ED with left lower extremity pain after a fall. Please review history and physical for details. The patient was found to have left tibia-fibula fracture, admitted to hospitalist service. Orthopedic consult was placed. Please review history and physical for details. EKG performed in the ED has revealed most likely type 2 Mobitz heart block.   HOSPITAL COURSE BASED ON THE PROBLEM:  1.  Acute fracture of the left tibia and fibula. Admitted to the hospitalist service. Orthopedics consult is placed with Dr. Hyacinth MeekerMiller. Pain management was provided. Cardiology was consulted for cardiac clearance in view of possible Mobitz type 2 heart block. Repeat EKG was done which has  revealed bradycardia, but not type 2 heart block as per my discussion with Dr. Juliann Pares. He has discontinued beta blocker permanently and the patient's heart rate was monitored closely. The patient has chronic bradycardia at 40s. After discontinuing beta blocker her heart rate went up to 50s. Cardiology has cleared her for surgery, but Dr. Hyacinth Meeker has recommended no surgery as the fracture is incomplete. He has recommended immobilizer and polar care as needed basis and pain management. He also has recommended the patient to follow up with him as an outpatient in  2 weeks. PT has evaluated the patient, and the patient is getting transferred to skilled nursing care for continuation of rehabilitation.  2.  Orthostatic hypotension causing dizziness. Gentle hydration was provided with IV fluids and her dizziness has significantly improved.  3.  Possible type 2 Mobitz heart block on EKG in the Emergency Department. Repeat EKG evaluated by cardiology, Dr. Juliann Pares. Did not reveal any type 2 heart block but bradycardia. He is not considering permanent pacemaker at this time. Discontinued beta blocker. Recommended outpatient follow-up.  4.  Altered mental status/delirium intermittent with hallucinations, multifactorial from healthcare-associated pneumonia, dehydration secondary to poor p.o. intake, underlying chronic dementia and severe depression.  5.  Healthcare-associated pneumonia, which could be the main reason for worsening of her delirium with underlying dementia. The patient was given Zosyn and levofloxacin. Repeat chest x-ray with no worsening of the pneumonia. The patient is clinically improving. Plan is to discharge her with levofloxacin, 4 more doses, with Florastor.  6.  Dehydration. The patient with poor p.o. intake. IV fluids were provided. Dehydration is improved. The patient started eating better today. We encouraged her to take p.o. fluids.  7.  Severe depression with significant weight loss of 80 pounds. She had significant medical workup done by primary care physician with no clear-cut etiology so the weight loss and failure to thrive is most likely from severe depression. Psychiatry consult is placed to Dr. Toni Amend. He has evaluated the patient. Family members are not comfortable to increase her trazodone dose as it will give her more sleepiness. Dr. Toni Amend has recommended to decrease the Zoloft dose to 50 mg and taper it off and he has introduced Lexapro 5 mg. The patient needs outpatient psychiatry follow-up for further management of this.  8.  Chronic  dementia, probably Lewy body dementia. Continue her home medication, Namenda, and outpatient followup with neurology, Dr. Malvin Johns. At one point, we thought the patient might have Parkinson disease and Sinemet was started, but in view of her hallucinations and hypotension Sinemet is discontinued. At this point of time, Dr. Loretha Brasil does not think the patient has Parkinson disease.  9.  Hypertension. Continue her home medications, losartan and amlodipine, and titrate as needed basis.  10.  Failure to thrive with severe protein calorie malnutrition. Significant weight loss of approximately 80 pounds as reported by the family, probably from severe depression. Medical work-up done by PCP is negative. Continue nutritional drink as prescribed. The patient needs more palatable food. She is reporting that she did not like hospital food which could have been aggravating the situation.  11.  Hyponatremia. With IV fluids her sodium increased to 133 and mentation is fine.   We are discharging the patient to skilled nursing care under satisfactory condition. Plan of care was discussed in detail with the patient, her husband and daughters. All their questions were answered. They verbalized understanding of the plan.  SIGNIFICANT DIAGNOSTIC DATA: Today's glucose is 108. BUN and creatinine are  normal. Sodium 133, potassium 3.6. GFR greater than 60. Calcium 8.3. WBC 8, hemoglobin 8.4, hematocrit 25.2, and platelets 291.   CAT scan of the head was done 2 times which was normal with no acute findings.  MRA of the brain without contrast: No acute reversible findings. Mild chronic small vessel ischemic changes of the pons and cerebral hemisphere white matter, less than often seen in a healthy individual for this age.   Echocardiogram performed on March 18th: Left ventricular ejection fraction 70% to 75%, normal global left ventricular systolic function, mildly elevated pulmonary artery systolic pressure, mild to moderate  tricuspid regurgitation.   Chest x-ray, PA and lateral view, on March 22nd, compared with March 19th: Stable left basilar changes similar to that seen on the previous exam.  CT of head without contrast on March 19th: Volume loss and white matter changes as above. No CT evidence of any acute intracranial abnormality.  The patient will be transferred to [facility] skilled nursing care for continuation of rehabilitation.   TOTAL TIME SPENT ON DISCHARGE AND COORDINATION OF CARE: 45 minutes.  ____________________________ Ramonita Lab, MD ag:sb D: 06/24/2014 12:47:18 ET T: 06/24/2014 13:28:46 ET JOB#: [id number]  cc: Ramonita Lab, MD, <Dictator> Barbette Reichmann, MD Paulita Fujita Malvin Johns, MD Pauletta Browns, MD Dwayne D. Juliann Pares, MD Audery Amel, MD Marya Amsler. Dareen Piano, MD Ramonita Lab MD ELECTRONICALLY SIGNED 07/04/2014 13:14

## 2014-08-03 NOTE — Consult Note (Signed)
Chief Complaint:  Subjective/Chief Complaint Patient feels better today  sitting  up eating. patient denies right knee worsening pain. patient is not preop for surgery but she needs rehab. denies any syncope dizziness or vertigo.   VITAL SIGNS/ANCILLARY NOTES: **Vital Signs.:   19-Mar-16 07:51  Vital Signs Type Routine  Temperature Temperature (F) 98.3  Celsius 36.8  Temperature Source oral  Pulse Pulse 49  Respirations Respirations 18  Systolic BP Systolic BP 161  Diastolic BP (mmHg) Diastolic BP (mmHg) 53  Mean BP 88  Pulse Ox % Pulse Ox % 96  Pulse Ox Activity Level  At rest  Oxygen Delivery Room Air/ 21 %  *Intake and Output.:   Daily 19-Mar-16 07:00  Grand Totals Intake:  1070 Output:      Net:  0960 45 Hr.:  4098  IV (Primary)      In:  979  IV (Secondary)      In:  91  Length of Stay Totals Intake:  1070 Output:      Net:  1070   Brief Assessment:  GEN well developed, well nourished, no acute distress   Cardiac Regular  murmur present  -- LE edema  -- JVD  --Gallop   Respiratory normal resp effort  clear BS   Gastrointestinal Normal   Gastrointestinal details normal Soft  Nontender  Nondistended   EXTR negative cyanosis/clubbing, negative edema, knee in traction   Lab Results: Routine Chem:  19-Mar-16 03:29   Glucose, Serum  184 (65-99 NOTE: New Reference Range  05/27/14)  BUN 13 (6-20 NOTE: New Reference Range  05/27/14)  Creatinine (comp) 0.57 (0.44-1.00 NOTE: New Reference Range  05/27/14)  Sodium, Serum  131 (135-145 NOTE: New Reference Range  05/27/14)  Potassium, Serum 4.1 (3.5-5.1 NOTE: New Reference Range  05/27/14)  Chloride, Serum  99 (101-111 NOTE: New Reference Range  05/27/14)  CO2, Serum 24 (22-32 NOTE: New Reference Range  05/27/14)  Calcium (Total), Serum  8.3 (8.9-10.3 NOTE: New Reference Range  05/27/14)  Anion Gap 8  eGFR (African American) >60  eGFR (Non-African American) >60 (eGFR values <12m/min/1.73 m2 may be an  indication of chronic kidney disease (CKD). Calculated eGFR is useful in patients with stable renal function. The eGFR calculation will not be reliable in acutely ill patients when serum creatinine is changing rapidly. It is not useful in patients on dialysis. The eGFR calculation may not be applicable to patients at the low and high extremes of body sizes, pregnant women, and vegetarians.)  Magnesium, Serum 1.7 (1.7-2.4 THERAPEUTIC RANGE: 4-7 mg/dL TOXIC: > 10 mg/dL  ----------------------- NOTE: New Reference Range  05/27/14)  Routine Hem:  19-Mar-16 03:29   WBC (CBC)  13.2  RBC (CBC)  2.93  Hemoglobin (CBC)  8.7  Hematocrit (CBC)  25.8  Platelet Count (CBC) 201  MCV 88  MCH 29.6  MCHC 33.6  RDW 14.0  Neutrophil % 81.1  Lymphocyte % 5.4  Monocyte % 13.4  Eosinophil % 0.0  Basophil % 0.1  Neutrophil #  10.7  Lymphocyte #  0.7  Monocyte #  1.8  Eosinophil # 0.0  Basophil # 0.0 (Result(s) reported on 21 Jun 2014 at 04:25AM.)   Radiology Results: XRay:    17-Mar-16 20:45, Femur Left  Femur Left   REASON FOR EXAM:    fall  COMMENTS:       PROCEDURE: DXR - DXR FEMUR LEFT  - Jun 19 2014  8:45PM     CLINICAL DATA:  Left leg pain  following fall, initial encounter    EXAM:  LEFT FEMUR - 2 VIEW    COMPARISON:  None.    FINDINGS:  There fractures noted in the proximal tibia and fibula which are  incompletely evaluated on this exam. The femur appears intact. The  visualized portions of the pelvic ring are unremarkable. A large  knee joint effusion is noted with fat fluid level consistent with  thefractures in the proximal tibia.     IMPRESSION:  No acute femoral fracture is noted. There fractures of the proximal  tibia and fibula with an associated joint effusion of the knee with  a fat fluid level.      Electronically Signed    By: Inez Catalina M.D.    On: 06/19/2014 20:51       Verified By: Everlene Farrier, M.D.,    17-Mar-16 21:00, Ankle Left Complete   Ankle Left Complete   REASON FOR EXAM:    fall  COMMENTS:       PROCEDURE: DXR - DXR ANKLE LEFT COMPLETE  - Jun 19 2014  9:00PM     CLINICAL DATA:  Left leg pain status post fall.    EXAM:  LEFT ANKLE COMPLETE - 3+ VIEW    COMPARISON:  None.    FINDINGS:  There is generalized osteopenia. There is no evidence of fracture,  dislocation, or joint effusion. There is no evidence of arthropathy  or other focal bone abnormality. Soft tissues are unremarkable.     IMPRESSION:  No acute osseous injury of the left ankle.      Electronically Signed    By: Kathreen Devoid    On: 06/19/2014 21:02         Verified By: Jennette Banker, M.D., MD    17-Mar-16 21:00, Elbow Right Complete  Elbow Right Complete   REASON FOR EXAM:    fall  COMMENTS:       PROCEDURE: DXR - DXR ELBOW RT COMP W/OBLIQUES  - Jun 19 2014  9:00PM     CLINICAL DATA:  Left leg pain status post fall. Right elbow pain  status post fall.    EXAM:  RIGHT ELBOW - COMPLETE 3+ VIEW    COMPARISON:  None.    FINDINGS:  There is generalized osteopenia. There is no evidence of fracture,  dislocation, or joint effusion. There is no evidence of arthropathy  or other focal bone abnormality. Soft tissues are unremarkable.     IMPRESSION:  No acute osseous injury of the right elbow.      Electronically Signed    By: Kathreen Devoid    On: 06/19/2014 21:05         Verified By: Jennette Banker, M.D., MD    17-Mar-16 21:00, Pelvis AP Only  Pelvis AP Only   REASON FOR EXAM:    fall, left leg pain  COMMENTS:       PROCEDURE: DXR - DXR PELVIS AP ONLY  - Jun 19 2014  9:00PM     CLINICAL DATA:  Left leg pain status post fall.  Initial encounter.    EXAM:  PELVIS - 1-2 VIEW    COMPARISON:  None.    FINDINGS:  There is no evidence of fracture or dislocation. Both femoral heads  are seated normally within their respective acetabula. Mild  degenerative change is noted along the lower lumbar spine. The  patient is  status post vertebroplasty at L2. The sacroiliac joints  are unremarkable in appearance.  The visualized bowel gas pattern is grossly unremarkable in  appearance. Scattered diverticulosis is noted along the proximal  sigmoid colon. A clip is seen overlying the left inguinal region.     IMPRESSION:  No evidence of fracture or dislocation.      Electronically Signed    By: Garald Balding M.D.    On: 06/19/2014 21:11     Verified By: JEFFREY . CHANG, M.D.,    17-Mar-16 21:00, Tibia And Fibula Left  Tibia And Fibula Left   REASON FOR EXAM:    fall  COMMENTS:       PROCEDURE: DXR - DXR TIBIA AND FIBULA LT (LOWER L  - Jun 19 2014  9:00PM     CLINICAL DATA:  Left leg pain status post fall.    EXAM:  LEFT TIBIA AND FIBULA - 2 VIEW    COMPARISON:  None.    FINDINGS:  Nondisplaced proximal fibular metaphysis fracture. Nondisplaced  lateral tibial plateau fracture with a fracture cleft extending into  the proximal diaphysis. Large knee joint effusion. Possible  nondisplaced fracture of the inferior pole of the patella.     IMPRESSION:  1.  Nondisplaced proximal fibular metaphysis fracture.  2. Nondisplaced lateral tibial plateau fracture with a fracture  cleft extending into the proximal diaphysis.  3. Possible nondisplaced fracture of the inferior pole of the  patella.  4. Large knee joint effusion.      Electronically Signed    By: Kathreen Devoid    On: 06/19/2014 21:07     Verified By: Jennette Banker, M.D., MD  Cardiology:    17-Mar-16 19:33, ED ECG  Ventricular Rate 54  Atrial Rate 104  P-R Interval 190  QRS Duration 126  QT 494  QTc 468  P Axis 80  R Axis 29  T Axis 10  ECG interpretation   Sinus tachycardia with 2nd degree A-V block (Mobitz II) with 2:1 A-V conduction  Right bundle branch block  Abnormal ECG  When compared with ECG of 10-Oct-2013 17:10,  Sinus rhythm is now with 2nd degree A-V block (Mobitz II)  ----------unconfirmed----------  Confirmed  by OVERREAD, NOT ([id number]), editor PEARSON, BARBARA ([id number]) on 06/20/2014 1:23:02 PM  ED ECG     18-Mar-16 09:01, ECG  Ventricular Rate 51  Atrial Rate 51  P-R Interval 172  QRS Duration 124  QT 572  QTc 527  P Axis 82  R Axis 41  T Axis 47  ECG interpretation   Sinus bradycardia  Right bundle branch block  Abnormal ECG  When compared with ECG of 19-Jun-2014 19:33,  No significant change was found  Confirmed by Rockey Situ, TIMOTHY ([id number]) on 06/20/2014 2:43:55 PM    Overreader: Ida Rogue  ECG     18-Mar-16 10:28, Echo Doppler  Echo Doppler   PRELIMINARY REPORT    The following is a PRELIMINARY Radiology report.  A final report will follow pending radiologist verification.      REASON FOR EXAM:      COMMENTS:       PROCEDURE: [facility] - ECHO DOPPLER COMPLETE(TRANSTHOR)  - Jun 20 2014 10:28AM     RESULT: Echocardiogram Report    Patient Name:   Julie Wiley Date of Exam: 06/20/2014  Medical Rec #:  [id number]             Custom1:  Date of Birth:  April 16, 1932          Height:       64.0 in  Patient Age:    81 years           Weight:       111.0 lb  Patient Gender: F                  BSA:          1.52 m??    Indications: Syncope,pre-op clearance  Sonographer:    Sherrie Sport RDCS  Referring Phys: Lance Coon, F    Summary:   1. Left ventricular ejection fraction, by visual estimation, is 70 to   75%.   2. Normal global left ventricular systolic function.   3. Mildly elevated pulmonary artery systolic pressure.   4. Mild to moderate tricuspid regurgitation.  2D AND M-MODE MEASUREMENTS (normal ranges within parentheses):  Left Ventricle:          Normal  IVSd (2D):      0.74 cm (0.7-1.1)  LVPWd (2D):     1.07 cm (0.7-1.1) Aorta/LA:                  Normal  LVIDd (2D):     4.72 cm (3.4-5.7) Aortic Root (2D): 3.60 cm (2.4-3.7)  LVIDs (2D):     2.66 cm        Left Atrium (2D): 4.20 cm (1.9-4.0)  LV FS (2D):     43.6 %   (>25%)  LV EF (2D):     74.8 %   (>50%)                                     Right Ventricle:                                    RVd (2D):        8.18 cm  LV DIASTOLIC FUNCTION:  MV Peak E: 1.17 m/s E/e' Ratio: 4.90  MV Peak A: 0.56 m/s Decel Time: 144 msec  E/A Ratio: 2.10  SPECTRAL DOPPLER ANALYSIS (where applicable):  Mitral Valve:  MV P1/2 Time: 41.76 msec  MV Area, PHT: 5.27 cm??  Aortic Valve: AoV Max Vel: 1.47 m/s AoV Peak PG: 8.7 mmHg AoV Mean PG:  LVOT Vmax: 1.16 m/s LVOT VTI:  LVOT Diameter: 2.00 cm  AoV Area, Vmax: 2.47 cm?? AoV Area, VTI:  AoV Area, Vmn:  Tricuspid Valve and PA/RV Systolic Pressure: TR Max Velocity: 2.97 m/s RA     Pressure: 5 mmHg RVSP/PASP: 40.3 mmHg  Pulmonic Valve:  PV Max Velocity: 1.28 m/s PV Max PG: 6.6 mmHg PV Mean PG:    PHYSICIAN INTERPRETATION:  Left Ventricle: The left ventricular internal cavity size was normal. LV   septal wall thickness was normal. LV posterior wall thickness was normal.   No left ventricular hypertrophy. Global LV systolic function was normal.   Left ventricular ejection fraction, by visual estimation, is 70 to 75%.  Right Ventricle: The right ventricular size is normal. Global RV systolic   function is normal.  Left Atrium: The left atrium is normal in size.  Right Atrium: The right atrium is normal in size.  Pericardium: There is no evidence of pericardial effusion.  Mitral Valve: The mitral valve is normal in structure. Trace mitral valve     regurgitation is seen.  Tricuspid Valve: The tricuspid valve is normal. Mild to moderate   tricuspid regurgitation  is visualized. The tricuspid regurgitant velocity   is 2.97 m/s, and with an assumed right atrial pressure of 5 mmHg, the   estimated right ventricular systolic pressure is mildly elevated at 40.3   mmHg.  Aortic Valve: The aortic valve is normal. No evidence of aortic valve   regurgitation is seen.  Pulmonic Valve: The pulmonic valve is normal.    [facility]  Electronically signed by Loraine MD  Signature  Date/Time: 06/20/2014/1:06:06 PM    *** Final ***  IMPRESSION: .        Dictated By: Ralene Cork . DEFAULT, M.D.,  CT:    17-Mar-16 20:00, CT Head Without Contrast  CT Head Without Contrast   REASON FOR EXAM:    fall to head  COMMENTS:       PROCEDURE: CT  - CT HEAD WITHOUT CONTRAST  - Jun 19 2014  8:00PM     CLINICAL DATA:  Status post fall. Hit the back of the head.  Headaches. Blurry vision.    EXAM:  CT HEAD WITHOUT CONTRAST    TECHNIQUE:  Contiguous axial images were obtained from the base of the skull  through the vertex without intravenous contrast.  COMPARISON:  MR brain 02/04/2014, CT brain 02/14/2013    FINDINGS:  There is no evidence of mass effect, midline shift, or extra-axial  fluid collections. There is no evidence of a space-occupying lesion  or intracranial hemorrhage. There is no evidence of a cortical-based  area of acute infarction. There is generalized cerebral atrophy.  There is periventricular white matter low attenuation likely  secondary to microangiopathy.    The ventricles and sulci are appropriate for the patient's age. The  basal cisterns are patent.    Visualized portions of the orbits are unremarkable. The visualized  portions of the paranasal sinuses and mastoid air cells are  unremarkable. Cerebrovascular atherosclerotic calcifications are  noted.    The osseous structures are unremarkable.     IMPRESSION:  1. No acute intracranial pathology.  2. Chronic microvascular disease and cerebral atrophy.      Electronically Signed    By: Kathreen Devoid    On: 06/19/2014 20:06       Verified By: Jennette Banker, M.D., MD   Assessment/Plan:  Assessment/Plan:  Assessment IMP  bradycardia  possible second-degree heart block  vertigo  dizzy  knee fracture  confusion  recent fall  coronary artery disease  hypertension  GERD  dementia  hyperlipidemia .   Plan Hold metoprolol indefinitely  continue Protonix for GERD  symptoms   agree with orthopedic treatment for rehab  pain control with morphine  reduce narcotics because of confusion  blood pressure control with losartan  consider adding amlodipine if additional blood pressure support as needed  continue dementia therapy  no clear indication for permanent pacemaker provided metoprolol was discontinued  DVT prophylaxis  follow-up with Cardiology as an outpatient   Electronic Signatures: Lujean Amel D (MD)  (Signed 19-Mar-16 14:07)  Authored: Chief Complaint, VITAL SIGNS/ANCILLARY NOTES, Brief Assessment, Lab Results, Radiology Results, Assessment/Plan   Last Updated: 19-Mar-16 14:07 by Yolonda Kida (MD)

## 2014-08-03 NOTE — H&P (Signed)
PATIENT NAME:  Julie Wiley, Julie Wiley MR#:  [id number] DATE OF BIRTH:  1933/03/02  DATE OF ADMISSION:  06/19/2014  CHIEF COMPLAINT: Fall.   HISTORY OF PRESENT ILLNESS: This is a 81 year old female who presented to the ED today with left lower extremity pain after a fall. She was found to have left tibial-fibular fracture and, per orthopedics, needs to be admitted in order to be evaluated tomorrow for potential repair. The patient states that she has 1 brick step and then another step up into the house from her garage. She got dizzy at one point and on that second step fell, and that is when she fractured her leg. The patient states that she did not lose consciousness. Family is also present in the room and corroborate the patient's story. In the ED, the patient was found on EKG to have what is likely Mobitz II heart block. Cardiology was consulted good and stated that they would follow along with this patient in the morning. The heart block was transient on the EKG she returned to normal sinus rhythm afterwards. The patient does report, by way of history, that she has been having some orthostatic symptoms lately that if she stands up too quickly she tends to get dizzy as well. Hospitalists were called for admission in order for the patient to be brought in to have her tibial-fibular fracture repair.   PRIMARY MEDICAL DOCTOR: Barbette Reichmann, MD   PAST MEDICAL HISTORY: Hyperlipidemia, hypertension, arthritis, incontinence, dementia, coronary artery disease.   CURRENT MEDICATIONS: Trazodone 50 mg 1/2 tablet at bedtime, sertraline 100 mg daily, Protonix 20 mg daily, Zofran 4 mg p.r.n. nausea, Myrbetriq 25 mg daily, memantine 5 mg daily, losartan 50 mg daily, budesonide 3 mg every other day, aspirin 81 mg daily, Norvasc 5 mg b.i.d.   PAST SURGICAL HISTORY: Triple coronary bypass; hysterectomy; kyphoplasty to L2 vertebra, cataract removal; tongue and nose cancer surgery, surgical removal of tongue and nose cancer.    ALLERGIES: ONLY TO CODEINE, WHICH CAUSED HER NAUSEA.   FAMILY HISTORY: Includes cancer.   SOCIAL HISTORY: The patient is a nonsmoker, nondrinker, and does not use any illicit drugs. She states that she was exposed to a fair amount of secondhand smoke when she was younger, as she grew up in a household where there was heavy smoking by her parents.   REVIEW OF SYSTEMS:  CONSTITUTIONAL: The patient denies fever, fatigue, weakness.  EYES: Denies double or blurred vision, pain or redness.  EAR, NOSE, AND THROAT: Denies ear pain, hearing loss, or difficulty swallowing.  RESPIRATORY: Denies cough, wheeze, dyspnea.  CARDIOVASCULAR: Denies chest pain, edema, or palpitations.  GASTROINTESTINAL: Denies nausea, vomiting, diarrhea, abdominal pain, or constipation.  GENITOURINARY: Denies dysuria, hematuria, or frequency.  ENDOCRINE: Denies nocturia or thyroid problems or heat or cold intolerance.  HEMATOLOGIC AND LYMPHATIC: Denies easy bruising, bleeding, swollen glands.  INTEGUMENTARY: Denies acne, rash, or lesion.  MUSCULOSKELETAL: She has significant left lower extremity pain after her fall in the area of her fracture. Otherwise, she denies any joint swelling, arthritis, or gout.  NEUROLOGICAL: Denies numbness, weakness, headache.  PSYCHIATRIC: Denies anxiety, insomnia, and depression.   PHYSICAL EXAMINATION:  VITAL SIGNS: Blood pressure 164/65, pulse 45, temperature 97.8, respirations 16, oxygen saturation 99% on room air.  GENERAL: This is a thin, elderly lady lying in bed in no acute distress.  HEENT: Pupils equal, round, react to light and accommodation. Extraocular movements intact. No scleral icterus. Moist mucosal membranes.  NECK: Her thyroid is not enlarged. Neck is  supple with no masses, nontender. No cervical adenopathy. No JVD.  RESPIRATORY: Clear to auscultation bilaterally with no rales, rhonchi, or wheezes. No respiratory distress.  CARDIOVASCULAR: Mild bradycardia with no murmur,  rubs, or gallops auscultated on exam. Good pedal pulses with no lower extremity edema.  ABDOMEN: Soft, nontender, nondistended. Good bowel sounds and no hepatosplenomegaly.  MUSCULOSKELETAL: She has a noticeable deformity in her left lower extremity with her ankle turned outward. She has significant pain in that left lower extremity. Otherwise, she has spontaneous movement in her other extremities with no cyanosis or clubbing.  SKIN: No rash or lesions. Her skin is warm and dry.  LYMPHATICS: No adenopathy.  NEUROLOGIC: Cranial nerves are intact. Sensation is intact throughout. No dysarthria or aphasia.  PSYCHIATRIC: She is alert and oriented, cooperative with good insight.   LABORATORY DATA: White count 4.8, hemoglobin 11, hematocrit 32, platelets 259,000. Sodium 136, potassium 3.7, chloride 100, CO2 of 27, BUN 13, creatinine 0.67, glucose 121. Troponin less than 0.03. Urinalysis negative. EKG as stated per HPI.   RADIOLOGY: X-ray of the left femur showed no fracture. X-ray of the left ankle showed no fracture. X-ray of the left elbow showed no fracture. X-ray of the pelvis showed no fracture-dislocation. X-ray of the left tibia and fibula showed nondisplaced proximal fibular metaphysis fracture, nondisplaced lateral tibial plateau fracture with a fracture cleft extending into the proximal diaphysis, possible nondisplaced fracture of the inferior pole of the patella, large knee joint effusion. CT head no acute intracranial pathology, chronic microvascular disease and cerebral atrophy.   ASSESSMENT AND PLAN:  1.  Acute fracture of the left tibia and fibula. We will order an orthopedic consult. They have already been made aware of the patient and will see her in the morning. We will admit her for evaluation for this and possible repair by them. We will provide adequate pain control for her until that time.  2.  Type II Mobitz heart block seen on EKG in the Emergency Department. Cardiology has been made  aware of this patient. We will consult with them to get their opinion about the importance of this transient finding. Some question as to whether or not this may or may not have contributed to her dizziness.  3.  Orthostatic symptoms. We will keep the patient hydrated with gentle hydration while she is here inpatient. This likely contributed to her fall at home.  4.  Hypertension. This is a chronic problem the patient has. We will continue her home medications and monitor to keep her blood pressure at goal.  5.  Incontinence. Continue the patient's home medications for this problem.  6.  Dementia. We will continue the patient's home medication of memantine. This problem is chronic and stable.  7.  Deep vein thrombosis prophylaxis with subcutaneous heparin. We will hold her morning dose of this medication in case she is going to be going for a procedure.   CODE STATUS: This patient is DNR.   TIME SPENT ON THIS ADMISSION: 50 minutes.     ____________________________ Candace Cruiseavid F. Anne HahnWillis, MD dfw:bm D: 06/20/2014 01:54:01 ET T: 06/20/2014 02:23:18 ET JOB#: [id number]  cc: Candace Cruiseavid F. Anne HahnWillis, MD, <Dictator> Tariyah Pendry Scotty CourtF Demesha Boorman MD ELECTRONICALLY SIGNED 06/20/2014 4:03

## 2014-08-03 NOTE — Consult Note (Signed)
PATIENT NAME:  Julie Wiley, Marvene C MR#:  [id number] DATE OF BIRTH:  07/14/1932  DATE OF CONSULTATION:  06/20/2014  REFERRING PHYSICIAN:   Dr. Anne HahnWillis CONSULTING PHYSICIAN:  Goran Olden D. Antonea Gaut, MD  INDICATION: Bradycardia preop for knee surgery.   HISTORY OF PRESENT ILLNESS: The patient is a 81 year old female with history of hyperlipidemia, dementia, coronary artery disease.   The patient was brought to the Emergency Room after a fall. It is not clear whether she tripped or just lost her balance but suffered a tibiofibular fracture, and was admitted for possible repair.   The patient reportedly got dizzy at some point and then fell causing a fracture. No loss of consciousness. The patient was found to have an EKG with bradycardia and evidence of a 2nd degree heart block possibly.   The patient was having some orthostatic symptoms when she stands or raises her  head from a sitting position and now preop for surgery. She was on metoprolol and it was held.  She has a history of cardiac disease in the past.   PAST MEDICAL HISTORY: Hyperlipidemia, hypertension, arthritis, incontinence, dementia, coronary artery disease.   MEDICATIONS: Trazodone 25 mg at bedtime, sertraline 100 mg a day, Protonix 20 mg a day, Zofran 4 mg as needed, Myrbetriq 25 mg daily, memantine 5 mg daily, losartan 50 mg a day, budesonide 3 mg every other day, aspirin 81 mg a day, Norvasc 5 mg twice a day.   PAST SURGICAL HISTORY: Coronary  bypass surgery, hysterectomy, kyphoplasty, cataract removal, tongue and  nose cancer surgery, surgical removal of tongue and nose cancer.   ALLERGIES: CODEINE.   FAMILY HISTORY: Cancer.   SOCIAL HISTORY: Nonsmoker, nondrinker. No drug use.   REVIEW OF SYSTEMS:  She has had dizziness,  weakness, a fall. No blackout spells or syncope. No nausea or vomiting. No fever. No chills. No sweats. No weight loss. No weight gain. No hemoptysis or hematemesis. No bright red blood per rectum. No vision change or  hearing change. Denies sputum production or cough.   PHYSICAL EXAMINATION: VITAL SIGNS: Blood pressure 160/60. Pulse of 50 and regular, respiratory rate 16, afebrile.  HEENT: Normocephalic, atraumatic. Pupils equally reactive to light.  Postsurgical tongue.  NECK: Supple. No significant JVD, bruits or adenopathy.  LUNGS: Clear to auscultation without rhonchi or rales.  HEART:   Bradycardic, systolic ejection murmur along the left sternal border.  ABDOMEN: Benign.  EXTREMITIES: Within normal limits. Right knee in traction and splinted,  immobilized.  NEUROLOGIC: Intact.  SKIN: Normal.   LABORATORY AND IMAGING:  White count of 4.8, hemoglobin 11, hematocrit 32, platelet count 259,000, sodium 136, potassium 2.7, chloride of 100, CO2 of 27, BUN of 30 and creatinine 0.67, glucose of 121. Troponin less than 0.03.   Urinalysis was negative. Radiology:  Fracture  of the tib/fib.   EKG shows bradycardia with possible 2nd degree heart block; nonspecific ST-T changes.   ASSESSMENT: Bradycardia, possible 2nd degree heart block; orthostatic hypotension, hypertension, incontinence, dementia, history of cancer.   PLAN: Agree with admit. Discontinue metoprolol. Follow-up EKG and telemetry. Echocardiogram would be useful.   Do not recommend permanent pacemaker placement at this point until completely off beta blockers for several days to see if her heart rate improves. Continue blood pressure control with ACE inhibitor or amlodipine.  The patient should be an  acceptable surgical risk to have her knee repaired. Recommend deep vein thrombosis prophylaxis, continue lipid management.  Continue dementia therapy.  GERD:   Continue Protonix therapy. Have  the patient treated medically.  Should be an acceptable surgical risk.  Observation.  Follow up as necessary. We will continue to follow the patient until discharge.    ____________________________ Bobbie Stack. Juliann Pares, MD ddc:tr D: 06/20/2014 13:35:53  ET T: 06/20/2014 14:04:50 ET JOB#: [id number]  cc: Brilynn Biasi D. Juliann Pares, MD, <Dictator> Alwyn Pea MD ELECTRONICALLY SIGNED 07/01/2014 9:05

## 2014-08-03 NOTE — Consult Note (Signed)
Referring Physician:  Ethlyn Daniels   Primary Care Physician:  [facility], [address], [place], [place] [address], [place] [phone]  Reason for Consult: Admit Date: 21-Jun-2014  Chief Complaint: fall  Reason for Consult: dementia   History of Present Illness: History of Present Illness:   see at reqeust of Dr. Jannifer Franklin for dementia;   81 yo RHD F presents to [facility] secondary to fall.  Pt has hx of dementia which has slowly progressed per husband over the past few years.  Pt sees Dr. Melrose Nakayama as outpatient.  Husband states that she can no longer put her glasses on like normal any more and he has noticed a tremor in her R hand.   She usually walks slow and kind of stupped over.  She is dependent on most ADLs with him and needs some help walking due to tremor.  He also reports that she has been complaining of dizziness too when she stands up.    ROS:  Review of Systems   difficult to obtain secondary to dementia  Past Medical/Surgical Hx:  collagenous colitis:   grover's disease:   arthritis:   tongue and nose ca with surgery:   triple bypass:   htn:   high cholesterol:   kyphoplasty balloon surg on L2:   breast biopsy:   cataract surg:   hysterectomy:   Past Medical/ Surgical Hx:  Past Medical History reviewed by me as above   Past Surgical History reviewed by me as above   Home Medications: Medication Instructions Last Modified Date/Time  Vitamin D2 50,000 intl units oral capsule 1 cap(s) orally once a week (on Wednesday) 17-Mar-16 20:43  aspirin 81 mg oral tablet 1 tab(s) orally once a day 17-Mar-16 20:43  budesonide 3 mg oral capsule, extended release 1 cap(s) orally every other day 17-Mar-16 20:43  pantoprazole 20 mg oral delayed release tablet 1 tab(s) orally once a day (in the morning) 17-Mar-16 20:43  sertraline 100 mg oral tablet 1 tab(s) orally once a day (in the morning) 17-Mar-16 20:43  ondansetron 4 mg oral tablet 1 tab(s)  orally once a day (in the morning), As Needed - for Nausea, Vomiting 17-Mar-16 20:43  amLODIPine 5 mg oral tablet 1 tab(s) orally 2 times a day 17-Mar-16 20:43  losartan 50 mg oral tablet 1 tab(s) orally once a day (in the morning) 17-Mar-16 20:43  Ocuvite Antioxidant Multiple Vitamins and Minerals oral tablet 1 tab(s) orally 2 times a day 17-Mar-16 20:43  multivitamin 1 tab(s) orally once a day 17-Mar-16 20:43  memantine 5 mg oral tablet 1 tab(s) orally once a day (in the evening) 17-Mar-16 20:43  traZODone 50 mg oral tablet 0.5 tab(s) orally once a day (at bedtime) 17-Mar-16 20:43  Myrbetriq 25 mg oral tablet, extended release 1 tab(s) orally once a day (at bedtime) 17-Mar-16 20:43   Allergies:  Codeine: GI Distress  Other- Explain in Comments Line: Unknown  Allergies:  Allergies codeine   Social/Family History: Employment Status: retired  Lives With: spouse  Living Arrangements: house  Social History: no tob, no EtOH, no illicits  Family History: no strokes, no seizures, no dementia   Vital Signs: **Vital Signs.:   21-Mar-16 11:50  Vital Signs Type Q 4hr  Temperature Temperature (F) 97.4  Celsius 36.3  Temperature Source oral  Pulse Pulse 52  Respirations Respirations 19  Systolic BP Systolic BP 389  Diastolic BP (mmHg) Diastolic BP (mmHg) 64  Mean BP 81  Pulse Ox %  Pulse Ox % 96  Pulse Ox Activity Level  At rest  Oxygen Delivery Room Air/ 21 %   Physical Exam: General: thin, NAD  HEENT: normocephalic, sclera nonicteric, oropharynx clear  Neck: supple, no JVD, no bruits  Chest: CTA B, no wheezing  Cardiac: RRR, no murmurs, no edema, 2+ pulses  Extremities: no C/C/E, FROM , a few bruises   Neurologic Exam: Mental Status: alert but oriented only to person, decent naming and repetition, poor distant recall, bradyphrenia;  apraxia with glasses and utensils  Cranial Nerves: PERRLA, EOMI with decreased upgaze and slow saccades, nl VF, face symmetric with hypomimia,  tongue midline, shoulder shrug equal  Motor Exam: mild drift in R UE otherwise 5 /5, increased tone on R, B bradykinesia, no tremor  Deep Tendon Reflexes: 1+/4 B, plantars mute  Sensory Exam: temp and light touch equal B  Coordination: FToN WNL, unable to test HTS   Lab Results: LabObservation:  18-Mar-16 10:28   OBSERVATION Reason for Test  Hepatic:  18-Mar-16 04:02   Bilirubin, Total 0.5 (0.3-1.2 NOTE: New Reference Range  05/27/14)  Alkaline Phosphatase 69 (38-126 NOTE: New Reference Range  05/27/14)  SGPT (ALT) 15 (14-54 NOTE: New Reference Range  05/27/14)  SGOT (AST) 15 (15-41 NOTE: New Reference Range  05/27/14)  Total Protein, Serum  5.1 (6.5-8.1 NOTE: New Reference Range  05/27/14)  Albumin, Serum  3.1 (3.5-5.0 NOTE: New reference range  05/27/14)  Routine BB:  18-Mar-16 10:53   ABO Group + Rh Type A Positive  Antibody Screen NEGATIVE (Result(s) reported on 20 Jun 2014 at 12:28PM.)  Routine Chem:  19-Mar-16 03:29   Magnesium, Serum 1.7 (1.7-2.4 THERAPEUTIC RANGE: 4-7 mg/dL TOXIC: > 10 mg/dL  ----------------------- NOTE: New Reference Range  05/27/14)  21-Mar-16 03:41   Glucose, Serum  105 (65-99 NOTE: New Reference Range  05/27/14)  BUN 11 (6-20 NOTE: New Reference Range  05/27/14)  Creatinine (comp) 0.50 (0.44-1.00 NOTE: New Reference Range  05/27/14)  Sodium, Serum  129 (135-145 NOTE: New Reference Range  05/27/14)  Potassium, Serum 3.6 (3.5-5.1 NOTE: New Reference Range  05/27/14)  Chloride, Serum  97 (101-111 NOTE: New Reference Range  05/27/14)  CO2, Serum 25 (22-32 NOTE: New Reference Range  05/27/14)  Calcium (Total), Serum  8.0 (8.9-10.3 NOTE: New Reference Range  05/27/14)  Anion Gap 7  eGFR (African American) >60  eGFR (Non-African American) >60 (eGFR values <13m/min/1.73 m2 may be an indication of chronic kidney disease (CKD). Calculated eGFR is useful in patients with stable renal function. The eGFR calculation will not be  reliable in acutely ill patients when serum creatinine is changing rapidly. It is not useful in patients on dialysis. The eGFR calculation may not be applicable to patients at the low and high extremes of body sizes, pregnant women, and vegetarians.)  Cardiac:  17-Mar-16 19:34   Troponin I <0.03 (0.00-0.03 0.03 ng/mL or less: NEGATIVE  Repeat testing in 3-6 hrs  if clinically indicated. >0.03 ng/mL: POTENTIAL  MYOCARDIAL INJURY. Repeat  testing in 3-6 hrs if  clinically indicated. NOTE: An increase or decrease  of 30% or more on serial  testing suggests a  clinically important change NOTE: New Reference Range  05/27/14)  Routine UA:  17-Mar-16 19:34   Color (UA) Yellow  Clarity (UA) Clear  Glucose (UA) Negative  Bilirubin (UA) Negative  Ketones (UA) Negative  Specific Gravity (UA) 1.008  Blood (UA) Negative  pH (UA) 6.0  Protein (UA) Negative  Nitrite (UA) Negative  Leukocyte  Esterase (UA) Negative (Result(s) reported on 19 Jun 2014 at 08:07PM.)  RBC (UA) 1 /HPF  WBC (UA) <1 /HPF  Bacteria (UA) TRACE  Epithelial Cells (UA) 2 /HPF  Mucous (UA) PRESENT (Result(s) reported on 19 Jun 2014 at 08:07PM.)  Routine Coag:  18-Mar-16 04:02   Prothrombin 14.5 (11.4-15.0 NOTE: New Reference Range  05/02/14)  INR 1.1 (INR reference interval applies to patients on anticoagulant therapy. A single INR therapeutic range for coumarins is not optimal for all indications; however, the suggested range for most indications is 2.0 - 3.0. Exceptions to the INR Reference Range may include: Prosthetic heart valves, acute myocardial infarction, prevention of myocardial infarction, and combinations of aspirin and anticoagulant. The need for a higher or lower target INR must be assessed individually. Reference: The Pharmacology and Management of the Vitamin K  antagonists: the seventh ACCP Conference on Antithrombotic and Thrombolytic Therapy. ZHYQM.5784 Sept:126 (3suppl): [id number]. A HCT  value >55% may artifactually increase the PT.  In one study,  the increase was an average of 25%. Reference:  "Effect on Routine and Special Coagulation Testing Values of Citrate Anticoagulant Adjustment in Patients with High HCT Values." American Journal of Clinical Pathology 2006;126:400-405.)  Routine Hem:  21-Mar-16 03:41   WBC (CBC) 9.6  RBC (CBC)  2.88  Hemoglobin (CBC)  8.5  Hematocrit (CBC)  25.2  Platelet Count (CBC) 228  MCV 88  MCH 29.3  MCHC 33.5  RDW 13.8  Neutrophil % 84.5  Lymphocyte % 5.9  Monocyte % 9.4  Eosinophil % 0.1  Basophil % 0.1  Neutrophil #  8.1  Lymphocyte #  0.6  Monocyte # 0.9  Eosinophil # 0.0  Basophil # 0.0 (Result(s) reported on 23 Jun 2014 at 04:48AM.)   Radiology Results: CT:    17-Mar-16 20:00, CT Head Without Contrast  CT Head Without Contrast   REASON FOR EXAM:    fall to head  COMMENTS:       PROCEDURE: CT  - CT HEAD WITHOUT CONTRAST  - Jun 19 2014  8:00PM     CLINICAL DATA:  Status post fall. Hit the back of the head.  Headaches. Blurry vision.    EXAM:  CT HEAD WITHOUT CONTRAST    TECHNIQUE:  Contiguous axial images were obtained from the base of the skull  through the vertex without intravenous contrast.  COMPARISON:  MR brain 02/04/2014, CT brain 02/14/2013    FINDINGS:  There is no evidence of mass effect, midline shift, or extra-axial  fluid collections. There is no evidence of a space-occupying lesion  or intracranial hemorrhage. There is no evidence of a cortical-based  area of acute infarction. There is generalized cerebral atrophy.  There is periventricular white matter low attenuation likely  secondary to microangiopathy.    The ventricles and sulci are appropriate for the patient's age. The  basal cisterns are patent.    Visualized portions of the orbits are unremarkable. The visualized  portions of the paranasal sinuses and mastoid air cells are  unremarkable. Cerebrovascular atherosclerotic  calcifications are  noted.    The osseous structures are unremarkable.     IMPRESSION:  1. No acute intracranial pathology.  2. Chronic microvascular disease and cerebral atrophy.      Electronically Signed    By: Kathreen Devoid    On: 06/19/2014 20:06       Verified By: Jennette Banker, M.D., MD    19-Mar-16 23:04, CT Head Without Contrast  CT Head Without Contrast  REASON FOR EXAM:    AMS  COMMENTS:       PROCEDURE: CT  - CT HEAD WITHOUT CONTRAST  - Jun 21 2014 11:04PM     CLINICAL DATA:  Recent fall, decline in mental status.  Hallucinations.    EXAM:  CT HEAD WITHOUT CONTRAST    TECHNIQUE:  Contiguous axial images were obtained from the base of the skull  through the vertex without intravenous contrast.  COMPARISON:  06/19/2014    FINDINGS:  Mild prominence of the sulci, cisterns, ventricles, in keeping with  cerebral and cerebellar volume loss. Mild periventricular and  subcortical white matter hypodensities, a nonspecific finding often  seen in the setting of chronic microangiopathic change. No definite  CT evidence of an acute infarction. No intraparenchymal hemorrhage,  mass, mass effect, or abnormal extra-axial fluid collection. The  visualized paranasal sinuses and mastoid air cells are predominantly  clear     IMPRESSION:  Volume loss and white matter changes as above. No CT evidence of an  acute intracranial abnormality.  Electronically Signed    By: Carlos Levering M.D.    On: 06/21/2014 23:21         Verified By: Tommi Rumps, M.D.,   Radiology Impression: Radiology Impression: CT of head personally reviewed by me and shows moderate atrophy and mild white matter changes   Impression/Recommendations: Recommendations:   prior notes reviewed by me reviewed by me   Apraxia-  due to sudden onset, we must consider stroke but neurodegerative d/o's can cause this as well;  there is also mild R sided weakness and some finger agnosia too  suspect L parietal infarct Parkinsonism-  this could have caused fall and could be associated with dementia (Lewy body dementia vs dementia with Parkinsons or even MSA)  there some signs of autonomic dysfunction which can be seen with any alpha synuclenopathy such as those listed. Dementia-  as above MRI of brain w/o contrast start Sinemet 25/100 TID watch for hallucinations orthostatic vitals continue ASA 87mdaily continue Namenda 538mdaily for now will follow  Electronic Signatures: SmJamison NeighborMD)  (Signed 21-Mar-16 13:22)  Authored: REFERRING PHYSICIAN, Primary Care Physician, Consult, History of Present Illness, Review of Systems, PAST MEDICAL/SURGICAL HISTORY, HOME MEDICATIONS, ALLERGIES, Social/Family History, NURSING VITAL SIGNS, Physical Exam-, LAB RESULTS, RADIOLOGY RESULTS, Recommendations   Last Updated: 21-Mar-16 13:22 by SmJamison NeighborMD)

## 2014-08-03 NOTE — Consult Note (Signed)
Chief Complaint:  Subjective/Chief Complaint Patient still complains of weakness fatigue improve mental status less delusional improve pain any still bradycardic   VITAL SIGNS/ANCILLARY NOTES: **Vital Signs.:   22-Mar-16 07:33  Vital Signs Type Routine  Temperature Temperature (F) 98.7  Celsius 37  Temperature Source oral  Pulse Pulse 51  Respirations Respirations 18  Systolic BP Systolic BP 299  Diastolic BP (mmHg) Diastolic BP (mmHg) 58  Mean BP 83  Pulse Ox % Pulse Ox % 97  Pulse Ox Activity Level  At rest  Oxygen Delivery Room Air/ 21 %  Telemetry pattern Cardiac Rhythm Bradycardia; Bundle Branch Block; pattern reported by Telemetry Clerk; 44  *Intake and Output.:   Daily 22-Mar-16 07:00  Grand Totals Intake:  240 Output:      Net:  240 24 Hr.:  240  Oral Intake      In:  240  Length of Stay Totals Intake:  2694 Output:      Net:  2694  Rehab Summary:   22-Mar-16 10:00  Rehab Progress Summary Rehab Progress Summary S: Pt agreeable to bed exercises with gentle encouragement and possible up in chair this p.m. Reports mild pain at rest; mild+ post exercises in L distal LE.  A: Supine bed exercises as noted above. Pt requires some increased instruction/cueing to grasp each exercises initially, but performs well once concept understood. Pt/daughter education regarding positioning of BLE for floating heels and LLE neutral versus external rotation. Discussed with nsg regarding possible bunny boot.  P: Had planned to see pt this afternoon for possible up in bed/out of bed rx; however, just spoke with MD regarding pt and pt is to be discharged to rehab this p.m. Complete current PT orders.  Anticipated Discharge Disposition  SNF/STR   Brief Assessment:  GEN well developed, well nourished, no acute distress   Cardiac Regular  murmur present  -- LE edema  -- JVD  --Gallop   Respiratory normal resp effort  clear BS   Gastrointestinal Normal   Gastrointestinal details normal  Soft  Nontender  Nondistended   EXTR negative cyanosis/clubbing, negative edema, knee in traction   Lab Results: Routine Chem:  22-Mar-16 04:11   Glucose, Serum  108 (65-99 NOTE: New Reference Range  05/27/14)  BUN 13 (6-20 NOTE: New Reference Range  05/27/14)  Creatinine (comp) 0.59 (0.44-1.00 NOTE: New Reference Range  05/27/14)  Sodium, Serum  133 (135-145 NOTE: New Reference Range  05/27/14)  Potassium, Serum 3.6 (3.5-5.1 NOTE: New Reference Range  05/27/14)  Chloride, Serum 101 (101-111 NOTE: New Reference Range  05/27/14)  CO2, Serum 27 (22-32 NOTE: New Reference Range  05/27/14)  Calcium (Total), Serum  8.3 (8.9-10.3 NOTE: New Reference Range  05/27/14)  Anion Gap  5  eGFR (African American) >60  eGFR (Non-African American) >60 (eGFR values <52m/min/1.73 m2 may be an indication of chronic kidney disease (CKD). Calculated eGFR is useful in patients with stable renal function. The eGFR calculation will not be reliable in acutely ill patients when serum creatinine is changing rapidly. It is not useful in patients on dialysis. The eGFR calculation may not be applicable to patients at the low and high extremes of body sizes, pregnant women, and vegetarians.)  Routine Hem:  22-Mar-16 04:11   WBC (CBC) 8.0  RBC (CBC)  2.89  Hemoglobin (CBC)  8.4  Hematocrit (CBC)  25.2  Platelet Count (CBC) 291  MCV 87  MCH 29.2  MCHC 33.4  RDW 13.4  Neutrophil % 77.5  Lymphocyte %  11.1  Monocyte % 10.2  Eosinophil % 1.0  Basophil % 0.2  Neutrophil # 6.2  Lymphocyte #  0.9  Monocyte # 0.8  Eosinophil # 0.1  Basophil # 0.0 (Result(s) reported on 24 Jun 2014 at [facility].)   Radiology Results: XRay:    17-Mar-16 20:45, Femur Left  Femur Left   REASON FOR EXAM:    fall  COMMENTS:       PROCEDURE: DXR - DXR FEMUR LEFT  - Jun 19 2014  8:45PM     CLINICAL DATA:  Left leg pain following fall, initial encounter    EXAM:  LEFT FEMUR - 2 VIEW    COMPARISON:   None.    FINDINGS:  There fractures noted in the proximal tibia and fibula which are  incompletely evaluated on this exam. The femur appears intact. The  visualized portions of the pelvic ring are unremarkable. A large  knee joint effusion is noted with fat fluid level consistent with  thefractures in the proximal tibia.     IMPRESSION:  No acute femoral fracture is noted. There fractures of the proximal  tibia and fibula with an associated joint effusion of the knee with  a fat fluid level.      Electronically Signed    By: Inez Catalina M.D.    On: 06/19/2014 20:51       Verified By: Everlene Farrier, M.D.,    17-Mar-16 21:00, Ankle Left Complete  Ankle Left Complete   REASON FOR EXAM:    fall  COMMENTS:       PROCEDURE: DXR - DXR ANKLE LEFT COMPLETE  - Jun 19 2014  9:00PM     CLINICAL DATA:  Left leg pain status post fall.    EXAM:  LEFT ANKLE COMPLETE - 3+ VIEW    COMPARISON:  None.    FINDINGS:  There is generalized osteopenia. There is no evidence of fracture,  dislocation, or joint effusion. There is no evidence of arthropathy  or other focal bone abnormality. Soft tissues are unremarkable.     IMPRESSION:  No acute osseous injury of the left ankle.      Electronically Signed    By: Kathreen Devoid    On: 06/19/2014 21:02         Verified By: Jennette Banker, M.D., MD    17-Mar-16 21:00, Elbow Right Complete  Elbow Right Complete   REASON FOR EXAM:    fall  COMMENTS:       PROCEDURE: DXR - DXR ELBOW RT COMP W/OBLIQUES  - Jun 19 2014  9:00PM     CLINICAL DATA:  Left leg pain status post fall. Right elbow pain  status post fall.    EXAM:  RIGHT ELBOW - COMPLETE 3+ VIEW    COMPARISON:  None.    FINDINGS:  There is generalized osteopenia. There is no evidence of fracture,  dislocation, or joint effusion. There is no evidence of arthropathy  or other focal bone abnormality. Soft tissues are unremarkable.     IMPRESSION:  No acute osseous injury of  the right elbow.      Electronically Signed    By: Kathreen Devoid    On: 06/19/2014 21:05         Verified By: Jennette Banker, M.D., MD    17-Mar-16 21:00, Pelvis AP Only  Pelvis AP Only   REASON FOR EXAM:    fall, left leg pain  COMMENTS:       PROCEDURE:  DXR - DXR PELVIS AP ONLY  - Jun 19 2014  9:00PM     CLINICAL DATA:  Left leg pain status post fall.  Initial encounter.    EXAM:  PELVIS - 1-2 VIEW    COMPARISON:  None.    FINDINGS:  There is no evidence of fracture or dislocation. Both femoral heads  are seated normally within their respective acetabula. Mild  degenerative change is noted along the lower lumbar spine. The  patient is status post vertebroplasty at L2. The sacroiliac joints  are unremarkable in appearance.    The visualized bowel gas pattern is grossly unremarkable in  appearance. Scattered diverticulosis is noted along the proximal  sigmoid colon. A clip is seen overlying the left inguinal region.     IMPRESSION:  No evidence of fracture or dislocation.      Electronically Signed    By: Garald Balding M.D.    On: 06/19/2014 21:11     Verified By: JEFFREY . CHANG, M.D.,    17-Mar-16 21:00, Tibia And Fibula Left  Tibia And Fibula Left   REASON FOR EXAM:    fall  COMMENTS:       PROCEDURE: DXR - DXR TIBIA AND FIBULA LT (LOWER L  - Jun 19 2014  9:00PM     CLINICAL DATA:  Left leg pain status post fall.    EXAM:  LEFT TIBIA AND FIBULA - 2 VIEW    COMPARISON:  None.    FINDINGS:  Nondisplaced proximal fibular metaphysis fracture. Nondisplaced  lateral tibial plateau fracture with a fracture cleft extending into  the proximal diaphysis. Large knee joint effusion. Possible  nondisplaced fracture of the inferior pole of the patella.     IMPRESSION:  1.  Nondisplaced proximal fibular metaphysis fracture.  2. Nondisplaced lateral tibial plateau fracture with a fracture  cleft extending into the proximal diaphysis.  3. Possible nondisplaced  fracture of the inferior pole of the  patella.  4. Large knee joint effusion.      Electronically Signed    By: Kathreen Devoid    On: 06/19/2014 21:07     Verified By: Jennette Banker, M.D., MD    19-Mar-16 23:13, Chest PA and Lateral  Chest PA and Lateral   REASON FOR EXAM:    AMS  COMMENTS:       PROCEDURE: DXR - DXR CHEST PA (OR AP) AND LATERAL  - Jun 21 2014 11:13PM     CLINICAL DATA:  Altered mental status.    EXAM:  CHEST  2 VIEW    COMPARISON:  10/27/2009    FINDINGS:  There is posterior left lower lobe consolidation. The right lung is  clear. There is moderate unchanged cardiomegaly and mild aortic  tortuosity. Pulmonary vasculature is normal.     IMPRESSION:  Left lower lobe consolidation, infectious or atelectatic. Unchanged  cardiomegaly.      Electronically Signed    By: Andreas Newport M.D.    On: 06/21/2014 23:16         Verified By: Andreas Newport, M.D.,    22-Mar-16 08:02, Chest PA and Lateral  Chest PA and Lateral   REASON FOR EXAM:    pna f/u  COMMENTS:       PROCEDURE: DXR - DXR CHEST PA (OR AP) AND LATERAL  - Jun 24 2014  8:02AM     CLINICAL DATA:  Bradycardia    EXAM:  CHEST  2 VIEW    COMPARISON:  06/21/2014  FINDINGS:  Cardiomegaly is again noted. Left basilar changes are again seen and  stable. No new focal infiltrate or sizable effusion is noted. No  acute bony abnormality is seen. Postsurgical changes are again  noted.     IMPRESSION:  Stable left basilar changes similar to that seen on the previous  exam.      Electronically Signed    By: Inez Catalina M.D.    On: 06/24/2014 08:19         Verified By: Everlene Farrier, M.D.,  MRI:    21-Mar-16 16:01, MRI Brain Without Contrast  MRI Brain Without Contrast   REASON FOR EXAM:    new onset R apraxia  COMMENTS:       PROCEDURE: MR  - MR BRAIN WO CONTRAST  - Jun 23 2014  4:01PM     CLINICAL DATA:  New onset apraxia. Right-sided. Symptoms  developed  06/19/2014.    EXAM:  MRI HEAD WITHOUT CONTRAST    TECHNIQUE:  Multiplanar, multiecho pulse sequences of the brain and surrounding  structures were obtained without intravenous contrast.  COMPARISON:  CT 06/21/2014.  MRI 02/04/2014.    FINDINGS:  Diffusion imaging does not show any acute or subacute infarction.  There are mild chronic small-vessel changes of the pons. No focal  cerebellar insult. The cerebral hemispheres show mild chronic  small-vessel changes of the white matter. No cortical or large  vessel territory infarction. No mass lesion, hemorrhage,  hydrocephalus or extra-axial collection. No pituitary mass. No  inflammatory sinus disease. No skull or skullbase lesion     IMPRESSION:  No acute or reversible finding. Mild chronic small-vessel ischemic  changes of the pons and cerebral hemispheric white matter, less than  often seen in healthy individuals of this age.  Electronically Signed    By: Nelson Chimes M.D.    On: 06/23/2014 16:08         Verified By: Jules Schick, M.D.,  Cardiology:    17-Mar-16 19:33, ED ECG  Ventricular Rate 54  Atrial Rate 104  P-R Interval 190  QRS Duration 126  QT 494  QTc 468  P Axis 80  R Axis 29  T Axis 10  ECG interpretation   Sinus tachycardia with 2nd degree A-V block (Mobitz II) with 2:1 A-V conduction  Right bundle branch block  Abnormal ECG  When compared with ECG of 10-Oct-2013 17:10,  Sinus rhythm is now with 2nd degree A-V block (Mobitz II)  ----------unconfirmed----------  Confirmed by OVERREAD, NOT ([id number]), editor PEARSON, BARBARA ([id number]) on 06/20/2014 1:23:02 PM  ED ECG     18-Mar-16 09:01, ECG  Ventricular Rate 51  Atrial Rate 51  P-R Interval 172  QRS Duration 124  QT 572  QTc 527  P Axis 82  R Axis 41  T Axis 47  ECG interpretation   Sinus bradycardia  Right bundle branch block  Abnormal ECG  When compared with ECG of 19-Jun-2014 19:33,  No significant change was found  Confirmed by  Rockey Situ, TIMOTHY ([id number]) on 06/20/2014 2:43:55 PM    Overreader: Ida Rogue  ECG     18-Mar-16 10:28, Echo Doppler  Echo Doppler   PRELIMINARY REPORT    The following is a PRELIMINARY Radiology report.  A final report will follow pending radiologist verification.      REASON FOR EXAM:      COMMENTS:       PROCEDURE: [facility] - ECHO DOPPLER COMPLETE(TRANSTHOR)  - Jun 20 2014 10:28AM  RESULT: Echocardiogram Report    Patient Name:   Julie Wiley Date of Exam: 06/20/2014  Medical Rec #:  [id number]             Custom1:  Date of Birth:  06/12/1932          Height:       64.0 in  Patient Age:    26 years           Weight:       111.0 lb  Patient Gender: F                  BSA:          1.52 m??    Indications: Syncope,pre-op clearance  Sonographer:    Sherrie Sport RDCS  Referring Phys: Lance Coon, F    Summary:   1. Left ventricular ejection fraction, by visual estimation, is 70 to   75%.   2. Normal global left ventricular systolic function.   3. Mildly elevated pulmonary artery systolic pressure.   4. Mild to moderate tricuspid regurgitation.  2D AND M-MODE MEASUREMENTS (normal ranges within parentheses):  Left Ventricle:          Normal  IVSd (2D):      0.74 cm (0.7-1.1)  LVPWd (2D):     1.07 cm (0.7-1.1) Aorta/LA:                  Normal  LVIDd (2D):     4.72 cm (3.4-5.7) Aortic Root (2D): 3.60 cm (2.4-3.7)  LVIDs (2D):     2.66 cm        Left Atrium (2D): 4.20 cm (1.9-4.0)  LV FS (2D):     43.6 %   (>25%)  LV EF (2D):     74.8 %   (>50%)                                    Right Ventricle:                                    RVd (2D):        1.11 cm  LV DIASTOLIC FUNCTION:  MV Peak E: 1.17 m/s E/e' Ratio: 4.90  MV Peak A: 0.56 m/s Decel Time: 144 msec  E/A Ratio: 2.10  SPECTRAL DOPPLER ANALYSIS (where applicable):  Mitral Valve:  MV P1/2 Time: 41.76 msec  MV Area, PHT: 5.27 cm??  Aortic Valve: AoV Max Vel: 1.47 m/s AoV Peak PG: 8.7 mmHg AoV Mean PG:  LVOT Vmax: 1.16  m/s LVOT VTI:  LVOT Diameter: 2.00 cm  AoV Area, Vmax: 2.47 cm?? AoV Area, VTI:  AoV Area, Vmn:  Tricuspid Valve and PA/RV Systolic Pressure: TR Max Velocity: 2.97 m/s RA     Pressure: 5 mmHg RVSP/PASP: 40.3 mmHg  Pulmonic Valve:  PV Max Velocity: 1.28 m/s PV Max PG: 6.6 mmHg PV Mean PG:    PHYSICIAN INTERPRETATION:  Left Ventricle: The left ventricular internal cavity size was normal. LV   septal wall thickness was normal. LV posterior wall thickness was normal.   No left ventricular hypertrophy. Global LV systolic function was normal.   Left ventricular ejection fraction, by visual estimation, is 70 to 75%.  Right Ventricle: The right ventricular size is normal. Global RV systolic   function is normal.  Left Atrium:  The left atrium is normal in size.  Right Atrium: The right atrium is normal in size.  Pericardium: There is no evidence of pericardial effusion.  Mitral Valve: The mitral valve is normal in structure. Trace mitral valve     regurgitation is seen.  Tricuspid Valve: The tricuspid valve is normal. Mild to moderate   tricuspid regurgitation is visualized. The tricuspid regurgitant velocity   is 2.97 m/s, and with an assumed right atrial pressure of 5 mmHg, the   estimated right ventricular systolic pressure is mildly elevated at 40.3   mmHg.  Aortic Valve: The aortic valve is normal. No evidence of aortic valve   regurgitation is seen.  Pulmonic Valve: The pulmonic valve is normal.    [facility]  Electronically signed by Sparkill MD  Signature Date/Time: 06/20/2014/1:06:06 PM    *** Final ***  IMPRESSION: .        Dictated By: Ralene Cork . DEFAULT, M.D.,  CT:    17-Mar-16 20:00, CT Head Without Contrast  CT Head Without Contrast   REASON FOR EXAM:    fall to head  COMMENTS:       PROCEDURE: CT  - CT HEAD WITHOUT CONTRAST  - Jun 19 2014  8:00PM     CLINICAL DATA:  Status post fall. Hit the back of the head.  Headaches. Blurry vision.    EXAM:  CT HEAD  WITHOUT CONTRAST    TECHNIQUE:  Contiguous axial images were obtained from the base of the skull  through the vertex without intravenous contrast.  COMPARISON:  MR brain 02/04/2014, CT brain 02/14/2013    FINDINGS:  There is no evidence of mass effect, midline shift, or extra-axial  fluid collections. There is no evidence of a space-occupying lesion  or intracranial hemorrhage. There is no evidence of a cortical-based  area of acute infarction. There is generalized cerebral atrophy.  There is periventricular white matter low attenuation likely  secondary to microangiopathy.    The ventricles and sulci are appropriate for the patient's age. The  basal cisterns are patent.    Visualized portions of the orbits are unremarkable. The visualized  portions of the paranasal sinuses and mastoid air cells are  unremarkable. Cerebrovascular atherosclerotic calcifications are  noted.    The osseous structures are unremarkable.     IMPRESSION:  1. No acute intracranial pathology.  2. Chronic microvascular disease and cerebral atrophy.      Electronically Signed    By: Kathreen Devoid    On: 06/19/2014 20:06       Verified By: Jennette Banker, M.D., MD    19-Mar-16 23:04, CT Head Without Contrast  CT Head Without Contrast   REASON FOR EXAM:    AMS  COMMENTS:       PROCEDURE: CT  - CT HEAD WITHOUT CONTRAST  - Jun 21 2014 11:04PM     CLINICAL DATA:  Recent fall, decline in mental status.  Hallucinations.    EXAM:  CT HEAD WITHOUT CONTRAST    TECHNIQUE:  Contiguous axial images were obtained from the base of the skull  through the vertex without intravenous contrast.  COMPARISON:  06/19/2014    FINDINGS:  Mild prominence of the sulci, cisterns, ventricles, in keeping with  cerebral and cerebellar volume loss. Mild periventricular and  subcortical white matter hypodensities, a nonspecific finding often  seen in the setting of chronic microangiopathic change. No definite  CT  evidence of an acute infarction. No intraparenchymal hemorrhage,  mass, mass  effect, or abnormal extra-axial fluid collection. The  visualized paranasal sinuses and mastoid air cells are predominantly  clear     IMPRESSION:  Volume loss and white matter changes as above. No CT evidence of an  acute intracranial abnormality.  Electronically Signed    By: Carlos Levering M.D.    On: 06/21/2014 23:21         Verified By: Tommi Rumps, M.D.,   Assessment/Plan:  Assessment/Plan:  Assessment IMP  sick sinus syndrome  delirium   weakness fatigue  bradycardia  possible second-degree heart block  vertigo  dizzy  knee fracture  confusion  recent fall  coronary artery disease  hypertension  GERD  dementia  hyperlipidemia .   Plan continue antibiotics for pneumonia  patient appears to be a candidate for permanent pacemaker because of symptomatic bradycardia  discontinue metoprolol  continue Protonix for GERD  symptoms  agree with orthopedic treatment for rehab  pain control with morphine  reduce narcotics because of confusion  blood pressure control with losartan  consider adding amlodipine if additional blood pressure support as needed  continue dementia therapy  will wait until the patient is treated for pneumonia before proceeding with pacemaker placement  DVT prophylaxis  follow-up with Cardiology as an outpatient   Electronic Signatures: Yolonda Kida (MD)  (Signed 22-Mar-16 13:37)  Authored: Chief Complaint, VITAL SIGNS/ANCILLARY NOTES, Brief Assessment, Lab Results, Radiology Results, Assessment/Plan   Last Updated: 22-Mar-16 13:37 by Yolonda Kida (MD)

## 2014-08-03 NOTE — Consult Note (Signed)
Chief Complaint:  Subjective/Chief Complaint Patient more awake today feels reasonably well had a bad night because of confusion and delirium denies any chest pain.   VITAL SIGNS/ANCILLARY NOTES: **Vital Signs.:   20-Mar-16 11:35  Vital Signs Type Q 4hr  Temperature Temperature (F) 97.5  Celsius 36.3  Temperature Source oral  Pulse Pulse 66  Respirations Respirations 16  Systolic BP Systolic BP 330  Diastolic BP (mmHg) Diastolic BP (mmHg) 59  Mean BP 82  Pulse Ox % Pulse Ox % 98  Pulse Ox Activity Level  At rest  Oxygen Delivery Room Air/ 21 %  *Intake and Output.:   Daily 20-Mar-16 07:00  Grand Totals Intake:  50 Output:      Net:  50 24 Hr.:  50  Oral Intake      In:  50  Urine ml     Out:  0  Length of Stay Totals Intake:  1120 Output:      Net:  1120   Brief Assessment:  GEN well developed, well nourished, no acute distress   Cardiac Regular  murmur present  -- LE edema  -- JVD  --Gallop   Respiratory normal resp effort  clear BS   Gastrointestinal Normal   Gastrointestinal details normal Soft  Nontender  Nondistended   EXTR negative cyanosis/clubbing, negative edema, knee in traction   Lab Results: Routine Chem:  19-Mar-16 03:29   Glucose, Serum  184 (65-99 NOTE: New Reference Range  05/27/14)  BUN 13 (6-20 NOTE: New Reference Range  05/27/14)  Creatinine (comp) 0.57 (0.44-1.00 NOTE: New Reference Range  05/27/14)  Sodium, Serum  131 (135-145 NOTE: New Reference Range  05/27/14)  Potassium, Serum 4.1 (3.5-5.1 NOTE: New Reference Range  05/27/14)  Chloride, Serum  99 (101-111 NOTE: New Reference Range  05/27/14)  CO2, Serum 24 (22-32 NOTE: New Reference Range  05/27/14)  Calcium (Total), Serum  8.3 (8.9-10.3 NOTE: New Reference Range  05/27/14)  Anion Gap 8  eGFR (African American) >60  eGFR (Non-African American) >60 (eGFR values <14m/min/1.73 m2 may be an indication of chronic kidney disease (CKD). Calculated eGFR is useful in  patients with stable renal function. The eGFR calculation will not be reliable in acutely ill patients when serum creatinine is changing rapidly. It is not useful in patients on dialysis. The eGFR calculation may not be applicable to patients at the low and high extremes of body sizes, pregnant women, and vegetarians.)  Magnesium, Serum 1.7 (1.7-2.4 THERAPEUTIC RANGE: 4-7 mg/dL TOXIC: > 10 mg/dL  ----------------------- NOTE: New Reference Range  05/27/14)  Routine Hem:  19-Mar-16 03:29   WBC (CBC)  13.2  RBC (CBC)  2.93  Hemoglobin (CBC)  8.7  Hematocrit (CBC)  25.8  Platelet Count (CBC) 201  MCV 88  MCH 29.6  MCHC 33.6  RDW 14.0  Neutrophil % 81.1  Lymphocyte % 5.4  Monocyte % 13.4  Eosinophil % 0.0  Basophil % 0.1  Neutrophil #  10.7  Lymphocyte #  0.7  Monocyte #  1.8  Eosinophil # 0.0  Basophil # 0.0 (Result(s) reported on 21 Jun 2014 at 04:25AM.)   Radiology Results: XRay:    17-Mar-16 20:45, Femur Left  Femur Left   REASON FOR EXAM:    fall  COMMENTS:       PROCEDURE: DXR - DXR FEMUR LEFT  - Jun 19 2014  8:45PM     CLINICAL DATA:  Left leg pain following fall, initial encounter    EXAM:  LEFT FEMUR -  2 VIEW    COMPARISON:  None.    FINDINGS:  There fractures noted in the proximal tibia and fibula which are  incompletely evaluated on this exam. The femur appears intact. The  visualized portions of the pelvic ring are unremarkable. A large  knee joint effusion is noted with fat fluid level consistent with  thefractures in the proximal tibia.     IMPRESSION:  No acute femoral fracture is noted. There fractures of the proximal  tibia and fibula with an associated joint effusion of the knee with  a fat fluid level.      Electronically Signed    By: Inez Catalina M.D.    On: 06/19/2014 20:51       Verified By: Everlene Farrier, M.D.,    17-Mar-16 21:00, Ankle Left Complete  Ankle Left Complete   REASON FOR EXAM:    fall  COMMENTS:        PROCEDURE: DXR - DXR ANKLE LEFT COMPLETE  - Jun 19 2014  9:00PM     CLINICAL DATA:  Left leg pain status post fall.    EXAM:  LEFT ANKLE COMPLETE - 3+ VIEW    COMPARISON:  None.    FINDINGS:  There is generalized osteopenia. There is no evidence of fracture,  dislocation, or joint effusion. There is no evidence of arthropathy  or other focal bone abnormality. Soft tissues are unremarkable.     IMPRESSION:  No acute osseous injury of the left ankle.      Electronically Signed    By: Kathreen Devoid    On: 06/19/2014 21:02         Verified By: Jennette Banker, M.D., MD    17-Mar-16 21:00, Elbow Right Complete  Elbow Right Complete   REASON FOR EXAM:    fall  COMMENTS:       PROCEDURE: DXR - DXR ELBOW RT COMP W/OBLIQUES  - Jun 19 2014  9:00PM     CLINICAL DATA:  Left leg pain status post fall. Right elbow pain  status post fall.    EXAM:  RIGHT ELBOW - COMPLETE 3+ VIEW    COMPARISON:  None.    FINDINGS:  There is generalized osteopenia. There is no evidence of fracture,  dislocation, or joint effusion. There is no evidence of arthropathy  or other focal bone abnormality. Soft tissues are unremarkable.     IMPRESSION:  No acute osseous injury of the right elbow.      Electronically Signed    By: Kathreen Devoid    On: 06/19/2014 21:05         Verified By: Jennette Banker, M.D., MD    17-Mar-16 21:00, Pelvis AP Only  Pelvis AP Only   REASON FOR EXAM:    fall, left leg pain  COMMENTS:       PROCEDURE: DXR - DXR PELVIS AP ONLY  - Jun 19 2014  9:00PM     CLINICAL DATA:  Left leg pain status post fall.  Initial encounter.    EXAM:  PELVIS - 1-2 VIEW    COMPARISON:  None.    FINDINGS:  There is no evidence of fracture or dislocation. Both femoral heads  are seated normally within their respective acetabula. Mild  degenerative change is noted along the lower lumbar spine. The  patient is status post vertebroplasty at L2. The sacroiliac joints  are  unremarkable in appearance.    The visualized bowel gas pattern is grossly unremarkable in  appearance. Scattered diverticulosis is noted along the proximal  sigmoid colon. A clip is seen overlying the left inguinal region.     IMPRESSION:  No evidence of fracture or dislocation.      Electronically Signed    By: Garald Balding M.D.    On: 06/19/2014 21:11     Verified By: JEFFREY . CHANG, M.D.,    17-Mar-16 21:00, Tibia And Fibula Left  Tibia And Fibula Left   REASON FOR EXAM:    fall  COMMENTS:       PROCEDURE: DXR - DXR TIBIA AND FIBULA LT (LOWER L  - Jun 19 2014  9:00PM     CLINICAL DATA:  Left leg pain status post fall.    EXAM:  LEFT TIBIA AND FIBULA - 2 VIEW    COMPARISON:  None.    FINDINGS:  Nondisplaced proximal fibular metaphysis fracture. Nondisplaced  lateral tibial plateau fracture with a fracture cleft extending into  the proximal diaphysis. Large knee joint effusion. Possible  nondisplaced fracture of the inferior pole of the patella.     IMPRESSION:  1.  Nondisplaced proximal fibular metaphysis fracture.  2. Nondisplaced lateral tibial plateau fracture with a fracture  cleft extending into the proximal diaphysis.  3. Possible nondisplaced fracture of the inferior pole of the  patella.  4. Large knee joint effusion.      Electronically Signed    By: Kathreen Devoid    On: 06/19/2014 21:07     Verified By: Jennette Banker, M.D., MD    19-Mar-16 23:13, Chest PA and Lateral  Chest PA and Lateral   REASON FOR EXAM:    AMS  COMMENTS:       PROCEDURE: DXR - DXR CHEST PA (OR AP) AND LATERAL  - Jun 21 2014 11:13PM     CLINICAL DATA:  Altered mental status.    EXAM:  CHEST  2 VIEW    COMPARISON:  10/27/2009    FINDINGS:  There is posterior left lower lobe consolidation. The right lung is  clear. There is moderate unchanged cardiomegaly and mild aortic  tortuosity. Pulmonary vasculature is normal.     IMPRESSION:  Left lower lobe consolidation,  infectious or atelectatic. Unchanged  cardiomegaly.      Electronically Signed    By: Andreas Newport M.D.    On: 06/21/2014 23:16         Verified By: Andreas Newport, M.D.,  Cardiology:    17-Mar-16 19:33, ED ECG  Ventricular Rate 54  Atrial Rate 104  P-R Interval 190  QRS Duration 126  QT 494  QTc 468  P Axis 80  R Axis 29  T Axis 10  ECG interpretation   Sinus tachycardia with 2nd degree A-V block (Mobitz II) with 2:1 A-V conduction  Right bundle branch block  Abnormal ECG  When compared with ECG of 10-Oct-2013 17:10,  Sinus rhythm is now with 2nd degree A-V block (Mobitz II)  ----------unconfirmed----------  Confirmed by OVERREAD, NOT ([id number]), editor PEARSON, BARBARA ([id number]) on 06/20/2014 1:23:02 PM  ED ECG     18-Mar-16 09:01, ECG  Ventricular Rate 51  Atrial Rate 51  P-R Interval 172  QRS Duration 124  QT 572  QTc 527  P Axis 82  R Axis 41  T Axis 47  ECG interpretation   Sinus bradycardia  Right bundle branch block  Abnormal ECG  When compared with ECG of 19-Jun-2014 19:33,  No significant change was found  Confirmed by  GOLLAN, TIMOTHY ([id number]) on 06/20/2014 2:43:55 PM    Overreader: Ida Rogue  ECG     18-Mar-16 10:28, Echo Doppler  Echo Doppler   PRELIMINARY REPORT    The following is a PRELIMINARY Radiology report.  A final report will follow pending radiologist verification.      REASON FOR EXAM:      COMMENTS:       PROCEDURE: [facility] - ECHO DOPPLER COMPLETE(TRANSTHOR)  - Jun 20 2014 10:28AM     RESULT: Echocardiogram Report    Patient Name:   Julie Wiley Date of Exam: 06/20/2014  Medical Rec #:  [id number]             Custom1:  Date of Birth:  29-May-1932          Height:       64.0 in  Patient Age:    81 years           Weight:       111.0 lb  Patient Gender: F                  BSA:          1.52 m??    Indications: Syncope,pre-op clearance  Sonographer:    Sherrie Sport RDCS  Referring Phys: Lance Coon, F    Summary:   1. Left  ventricular ejection fraction, by visual estimation, is 70 to   75%.   2. Normal global left ventricular systolic function.   3. Mildly elevated pulmonary artery systolic pressure.   4. Mild to moderate tricuspid regurgitation.  2D AND M-MODE MEASUREMENTS (normal ranges within parentheses):  Left Ventricle:          Normal  IVSd (2D):      0.74 cm (0.7-1.1)  LVPWd (2D):     1.07 cm (0.7-1.1) Aorta/LA:                  Normal  LVIDd (2D):     4.72 cm (3.4-5.7) Aortic Root (2D): 3.60 cm (2.4-3.7)  LVIDs (2D):     2.66 cm        Left Atrium (2D): 4.20 cm (1.9-4.0)  LV FS (2D):     43.6 %   (>25%)  LV EF (2D):     74.8 %   (>50%)                                    Right Ventricle:                                    RVd (2D):        1.96 cm  LV DIASTOLIC FUNCTION:  MV Peak E: 1.17 m/s E/e' Ratio: 4.90  MV Peak A: 0.56 m/s Decel Time: 144 msec  E/A Ratio: 2.10  SPECTRAL DOPPLER ANALYSIS (where applicable):  Mitral Valve:  MV P1/2 Time: 41.76 msec  MV Area, PHT: 5.27 cm??  Aortic Valve: AoV Max Vel: 1.47 m/s AoV Peak PG: 8.7 mmHg AoV Mean PG:  LVOT Vmax: 1.16 m/s LVOT VTI:  LVOT Diameter: 2.00 cm  AoV Area, Vmax: 2.47 cm?? AoV Area, VTI:  AoV Area, Vmn:  Tricuspid Valve and PA/RV Systolic Pressure: TR Max Velocity: 2.97 m/s RA     Pressure: 5 mmHg RVSP/PASP: 40.3 mmHg  Pulmonic Valve:  PV Max Velocity:  1.28 m/s PV Max PG: 6.6 mmHg PV Mean PG:    PHYSICIAN INTERPRETATION:  Left Ventricle: The left ventricular internal cavity size was normal. LV   septal wall thickness was normal. LV posterior wall thickness was normal.   No left ventricular hypertrophy. Global LV systolic function was normal.   Left ventricular ejection fraction, by visual estimation, is 70 to 75%.  Right Ventricle: The right ventricular size is normal. Global RV systolic   function is normal.  Left Atrium: The left atrium is normal in size.  Right Atrium: The right atrium is normal in size.  Pericardium: There is no  evidence of pericardial effusion.  Mitral Valve: The mitral valve is normal in structure. Trace mitral valve     regurgitation is seen.  Tricuspid Valve: The tricuspid valve is normal. Mild to moderate   tricuspid regurgitation is visualized. The tricuspid regurgitant velocity   is 2.97 m/s, and with an assumed right atrial pressure of 5 mmHg, the   estimated right ventricular systolic pressure is mildly elevated at 40.3   mmHg.  Aortic Valve: The aortic valve is normal. No evidence of aortic valve   regurgitation is seen.  Pulmonic Valve: The pulmonic valve is normal.    [facility]  Electronically signed by [place] MD  Signature Date/Time: 06/20/2014/1:06:06 PM    *** Final ***  IMPRESSION: .        Dictated By: Ralene Cork . DEFAULT, M.D.,  CT:    17-Mar-16 20:00, CT Head Without Contrast  CT Head Without Contrast   REASON FOR EXAM:    fall to head  COMMENTS:       PROCEDURE: CT  - CT HEAD WITHOUT CONTRAST  - Jun 19 2014  8:00PM     CLINICAL DATA:  Status post fall. Hit the back of the head.  Headaches. Blurry vision.    EXAM:  CT HEAD WITHOUT CONTRAST    TECHNIQUE:  Contiguous axial images were obtained from the base of the skull  through the vertex without intravenous contrast.  COMPARISON:  MR brain 02/04/2014, CT brain 02/14/2013    FINDINGS:  There is no evidence of mass effect, midline shift, or extra-axial  fluid collections. There is no evidence of a space-occupying lesion  or intracranial hemorrhage. There is no evidence of a cortical-based  area of acute infarction. There is generalized cerebral atrophy.  There is periventricular white matter low attenuation likely  secondary to microangiopathy.    The ventricles and sulci are appropriate for the patient's age. The  basal cisterns are patent.    Visualized portions of the orbits are unremarkable. The visualized  portions of the paranasal sinuses and mastoid air cells are  unremarkable.  Cerebrovascular atherosclerotic calcifications are  noted.    The osseous structures are unremarkable.     IMPRESSION:  1. No acute intracranial pathology.  2. Chronic microvascular disease and cerebral atrophy.      Electronically Signed    By: Kathreen Devoid    On: 06/19/2014 20:06       Verified By: Jennette Banker, M.D., MD    19-Mar-16 23:04, CT Head Without Contrast  CT Head Without Contrast   REASON FOR EXAM:    AMS  COMMENTS:       PROCEDURE: CT  - CT HEAD WITHOUT CONTRAST  - Jun 21 2014 11:04PM     CLINICAL DATA:  Recent fall, decline in mental status.  Hallucinations.    EXAM:  CT HEAD WITHOUT  CONTRAST    TECHNIQUE:  Contiguous axial images were obtained from the base of the skull  through the vertex without intravenous contrast.  COMPARISON:  06/19/2014    FINDINGS:  Mild prominence of the sulci, cisterns, ventricles, in keeping with  cerebral and cerebellar volume loss. Mild periventricular and  subcortical white matter hypodensities, a nonspecific finding often  seen in the setting of chronic microangiopathic change. No definite  CT evidence of an acute infarction. No intraparenchymal hemorrhage,  mass, mass effect, or abnormal extra-axial fluid collection. The  visualized paranasal sinuses and mastoid air cells are predominantly  clear     IMPRESSION:  Volume loss and white matter changes as above. No CT evidence of an  acute intracranial abnormality.  Electronically Signed    By: Carlos Levering M.D.    On: 06/21/2014 23:21         Verified By: Tommi Rumps, M.D.,   Assessment/Plan:  Assessment/Plan:  Assessment IMP   weakness fatigue  bradycardia  possible second-degree heart block  vertigo  dizzy  knee fracture  confusion  recent fall  coronary artery disease  hypertension  GERD  dementia  hyperlipidemia .   Plan Hold metoprolol indefinitely  continue Protonix for GERD  symptoms  agree with orthopedic treatment for  rehab  pain control with morphine  reduce narcotics because of confusion  blood pressure control with losartan  consider adding amlodipine if additional blood pressure support as needed  continue dementia therapy  no clear indication for permanent pacemaker provided metoprolol was discontinued  DVT prophylaxis  follow-up with Cardiology as an outpatient   Electronic Signatures: Yolonda Kida (MD)  (Signed 20-Mar-16 16:54)  Authored: Chief Complaint, VITAL SIGNS/ANCILLARY NOTES, Brief Assessment, Lab Results, Radiology Results, Assessment/Plan   Last Updated: 20-Mar-16 16:54 by Lujean Amel D (MD)

## 2014-08-03 NOTE — Consult Note (Signed)
Brief Consult Note: Diagnosis: preop for knee surgery/ bradycardia.   Patient was seen by consultant.   Consult note dictated.   Recommend to proceed with surgery or procedure.   Orders entered.   Discussed with Attending MD.   Comments: IMP  preop knee  bradycardia  possible heart block  dementia  fall  hyperlipidemia  hypertension  coronary artery disease .  patient appears to be in a preoperative acceptable risk  hold metoprolol  continue telemetry  hold off on pacemaker at this point  conservative medical therapy  continue lipid management  continue dementia therapy  follow-up with Cardiology after surgery.  Electronic Signatures: Dorothyann Pengallwood, Aishah Teffeteller D (MD)  (Signed 18-Mar-16 13:21)  Authored: Brief Consult Note   Last Updated: 18-Mar-16 13:21 by Alwyn Peaallwood, Sissy Goetzke D (MD)

## 2014-08-03 DEATH — deceased

## 2014-12-14 IMAGING — CT CT ABD-PELV W/ CM
2 of 5 series · 16 of 46 positions shown, 18 images · IV contrast (isovue)
Comparison: 03/11/2005

CLINICAL DATA: Right abdominal pain, nausea.

EXAM:
CT ABDOMEN AND PELVIS WITH CONTRAST
TECHNIQUE: Multidetector CT imaging of the abdomen and pelvis was performed
using the standard protocol following bolus administration of
intravenous contrast.
CONTRAST:  100 cc Isovue 370 IV

[Series 2: routine abd pel with · axial · 0.68mm/px · z∈[-1220,-854]mm · 13 of 83 slices shown, 15 images]
[im 5/83  soft-tissue]
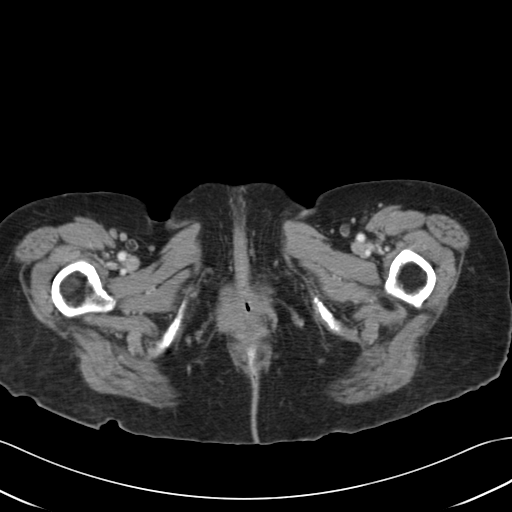
[im 5/83  bone]
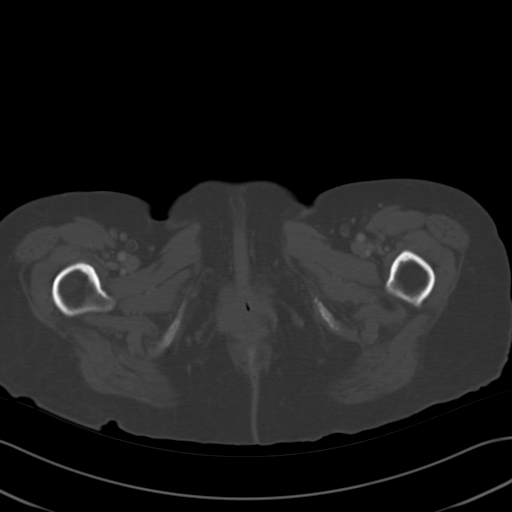
[im 13/83  soft-tissue]
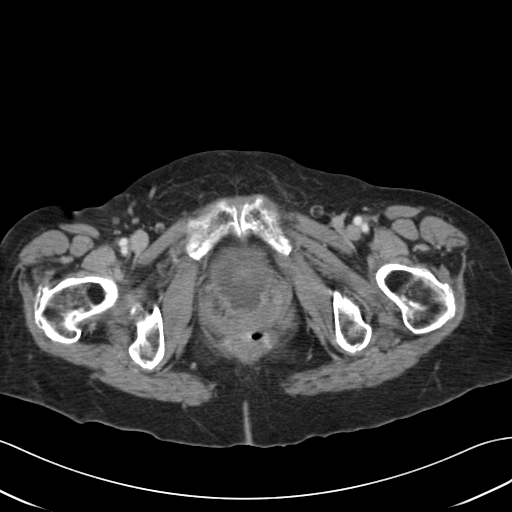
[im 18/83  soft-tissue]
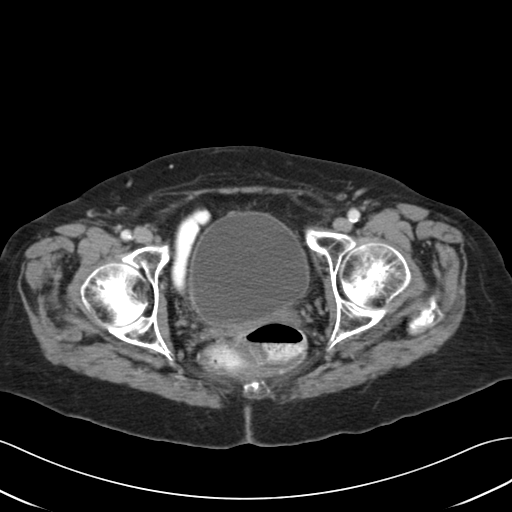
[im 22/83  soft-tissue]
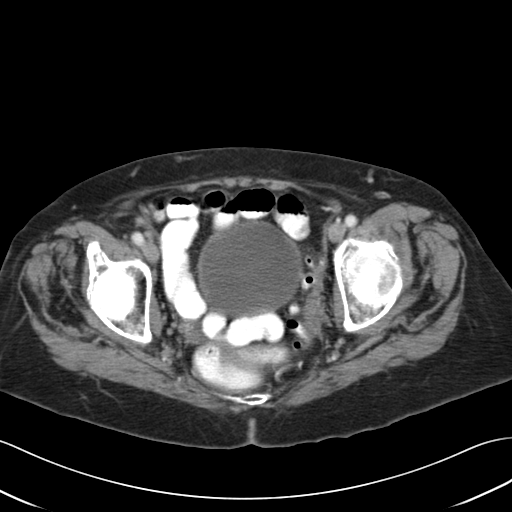
[im 31/83  soft-tissue]
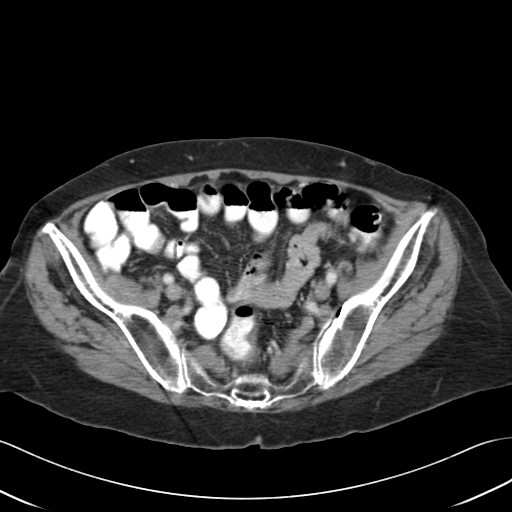
[im 35/83  soft-tissue]
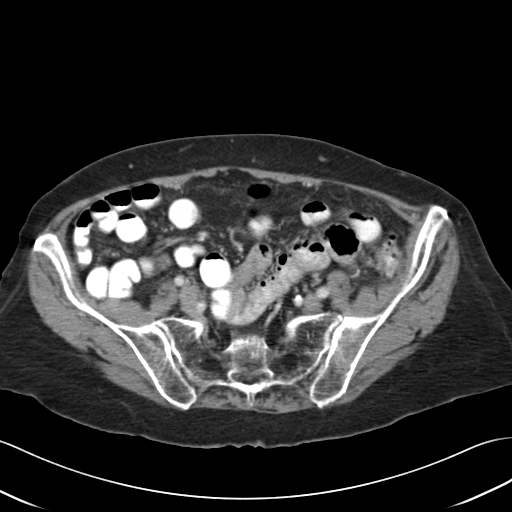
[im 44/83  soft-tissue]
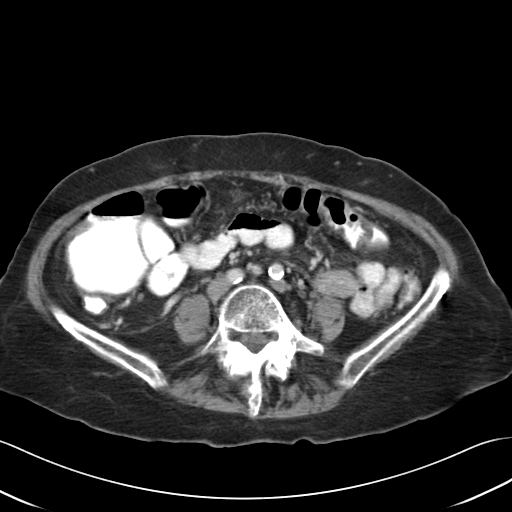
[im 48/83  soft-tissue]
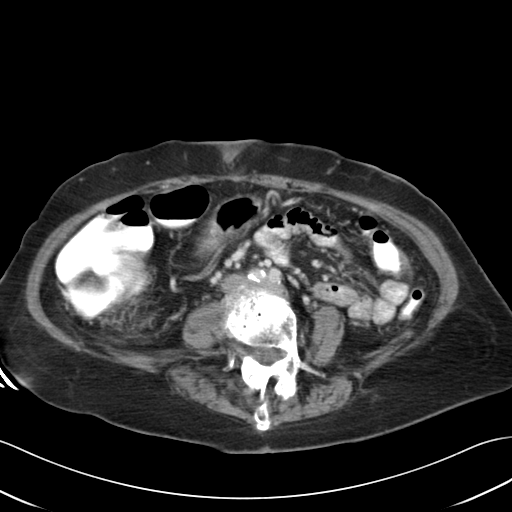
[im 52/83  soft-tissue]
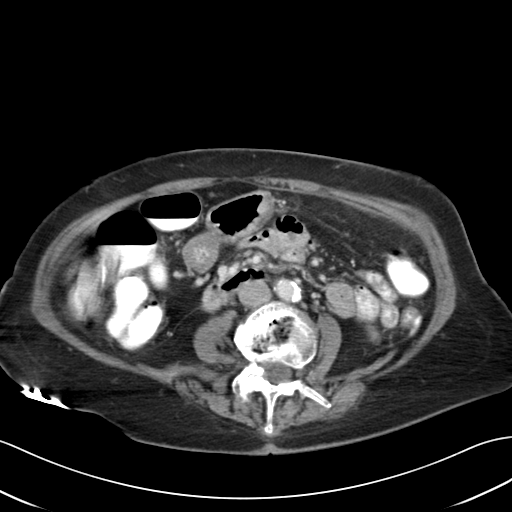
[im 52/83  bone]
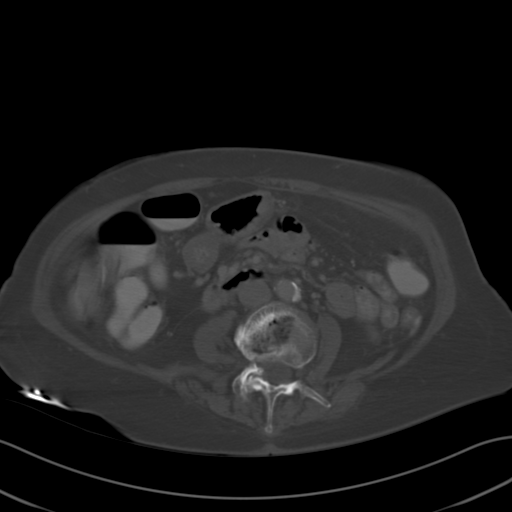
[im 61/83  soft-tissue]
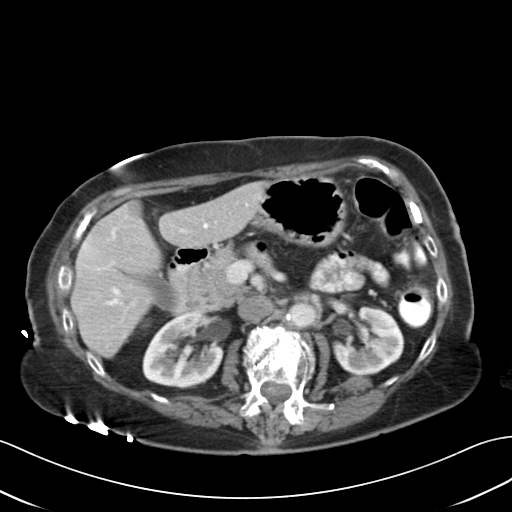
[im 65/83  soft-tissue]
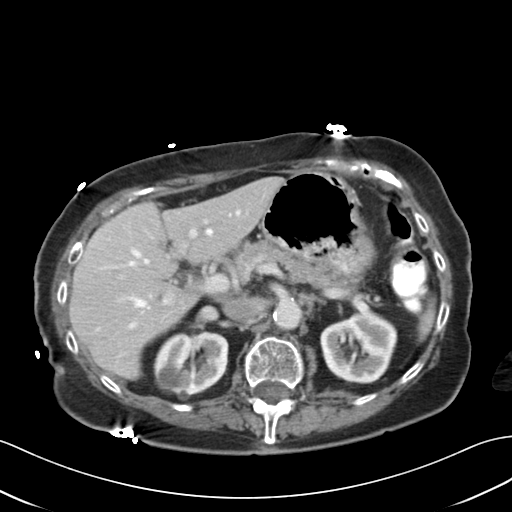
[im 70/83  soft-tissue]
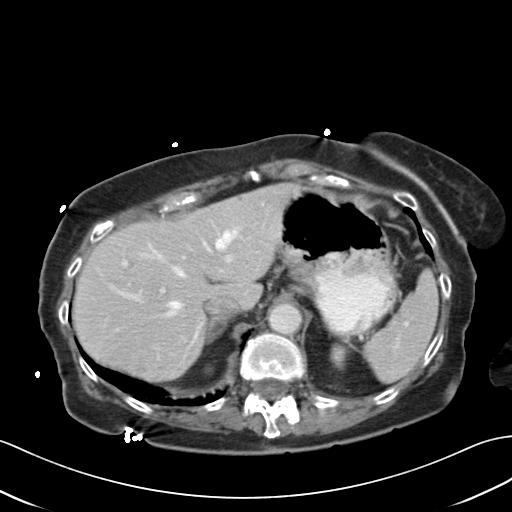
[im 78/83  soft-tissue]
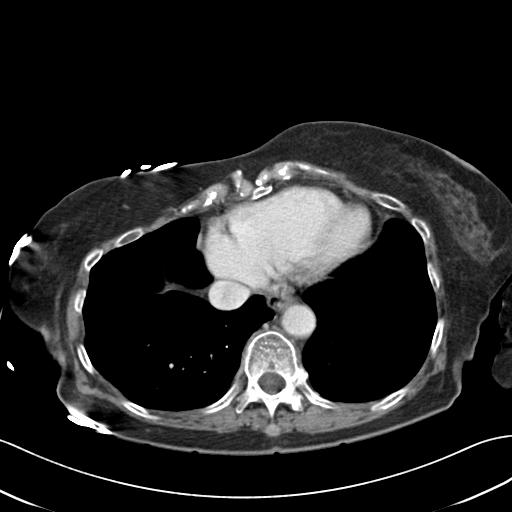

[Series 6: cor routine abd pel with · coronal · 0.65mm/px · 3 of 108 slices shown]
[im 36/108  soft-tissue]
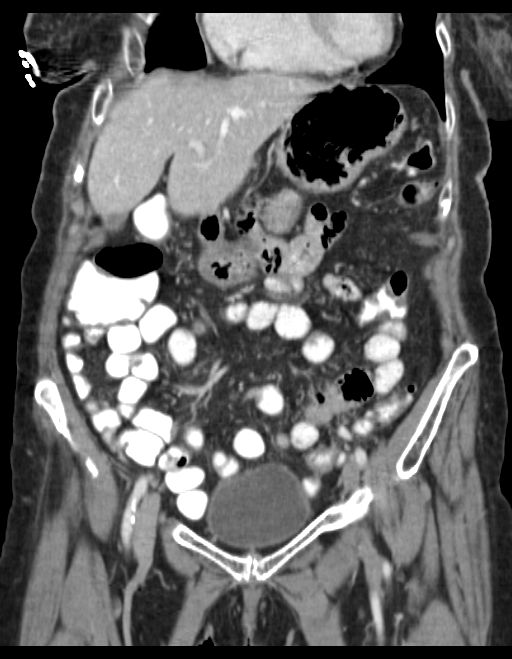
[im 48/108  soft-tissue]
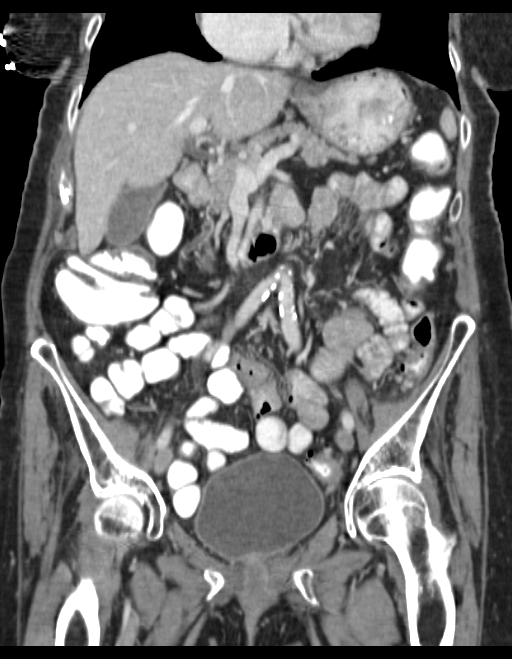
[im 60/108  soft-tissue]
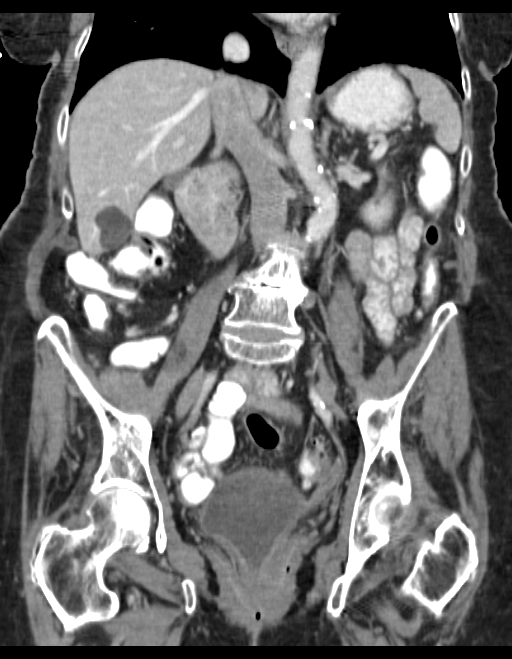

[16 of 46 positions shown; findings below may reference images not displayed]

FINDINGS: Heart is mildly enlarged. Lung bases are clear. No effusions.

Hypodensities within the liver, the largest in the inferior right
hepatic lobe measuring 3.1 cm. This has enlarged since prior study
when this measured 2.1 cm maximally, but appears most consistent
with a benign cyst. Gallbladder, spleen, pancreas, adrenals are
unremarkable. Benign-appearing cysts in the kidneys bilaterally,
right larger than left. No hydronephrosis.

There is sigmoid and descending colonic diverticulosis. No active
diverticulitis. Small bowel is decompressed.

Prior hysterectomy. No adnexal masses. Urinary bladder is
unremarkable.

Aorta and iliac vessels are calcified, non aneurysmal. Scoliosis and
degenerative changes in the lumbar spine. Prior vertebroplasty at
L2.
IMPRESSION: Colonic diverticulosis.  No active diverticulitis.

Hepatic and renal cysts which appear benign.

No acute findings abdomen or pelvis.

## 2016-04-17 IMAGING — CR PELVIS - 1-2 VIEW
1 series · 1 of 1 positions shown · non-contrast
Comparison: None.

CLINICAL DATA: Left leg pain status post fall.  Initial encounter.

EXAM:
PELVIS - 1-2 VIEW

[t pelvis ap]
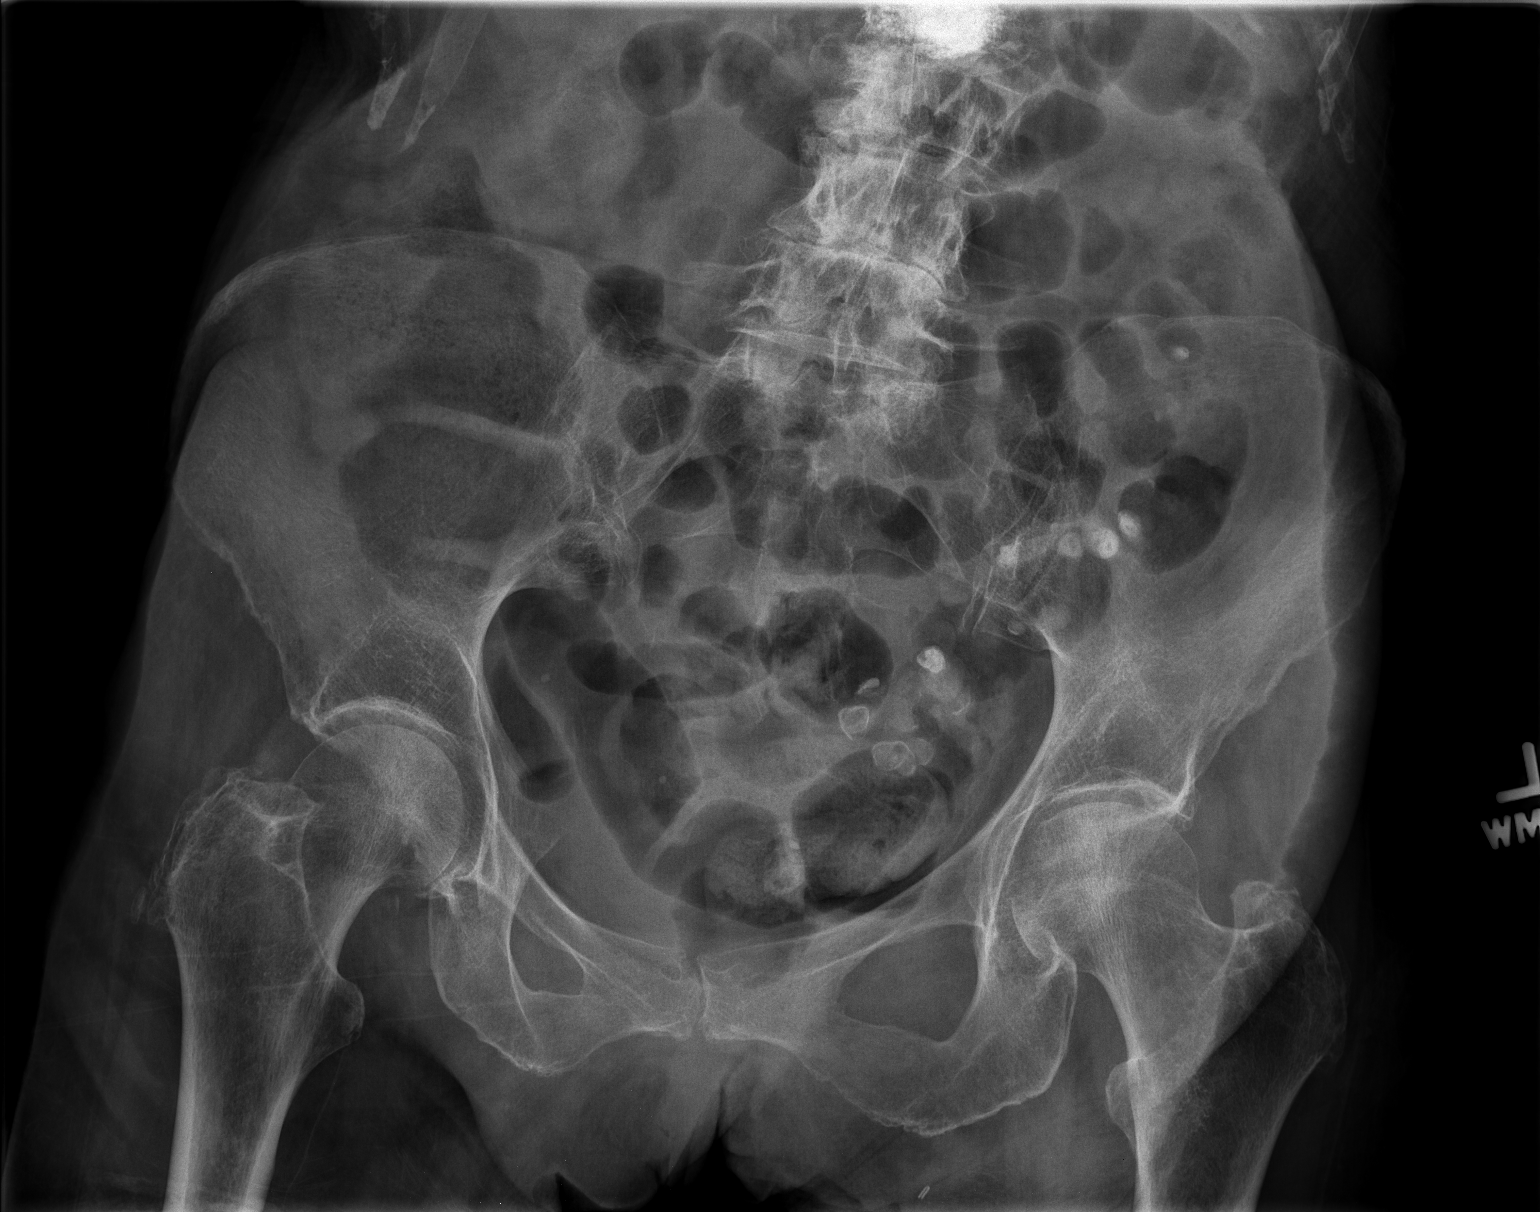

[1 of 1 positions shown; findings below may reference images not displayed]

FINDINGS: There is no evidence of fracture or dislocation. Both femoral heads
are seated normally within their respective acetabula. Mild
degenerative change is noted along the lower lumbar spine. The
patient is status post vertebroplasty at L2. The sacroiliac joints
are unremarkable in appearance.

The visualized bowel gas pattern is grossly unremarkable in
appearance. Scattered diverticulosis is noted along the proximal
sigmoid colon. A clip is seen overlying the left inguinal region.
IMPRESSION: No evidence of fracture or dislocation.

## 2016-04-17 IMAGING — CR DG FEMUR 2V*L*
1 series · 5 of 5 positions shown · non-contrast
Comparison: None.

CLINICAL DATA: Left leg pain following fall, initial encounter

EXAM:
LEFT FEMUR - 2 VIEW

[Series 1: t femur proximal ap left · 0.14mm/px · 5 of 5 slices shown]
[im 1/5]
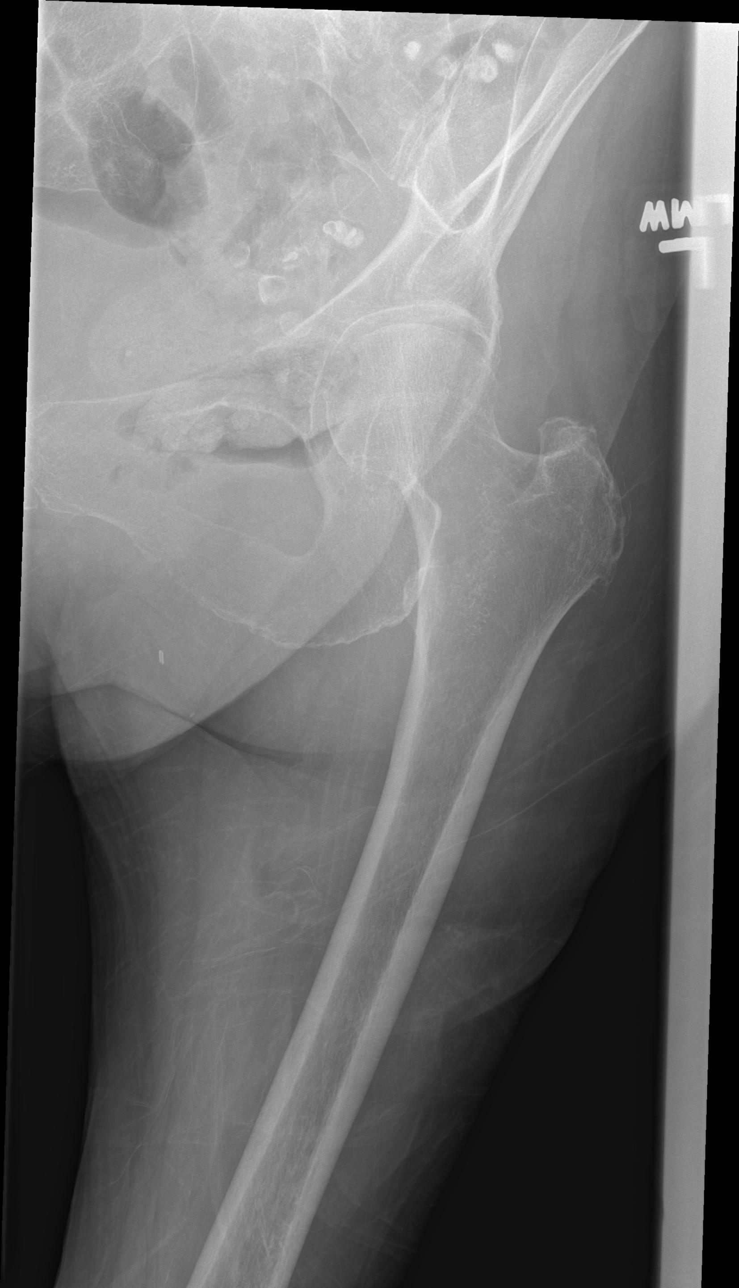
[im 2/5]
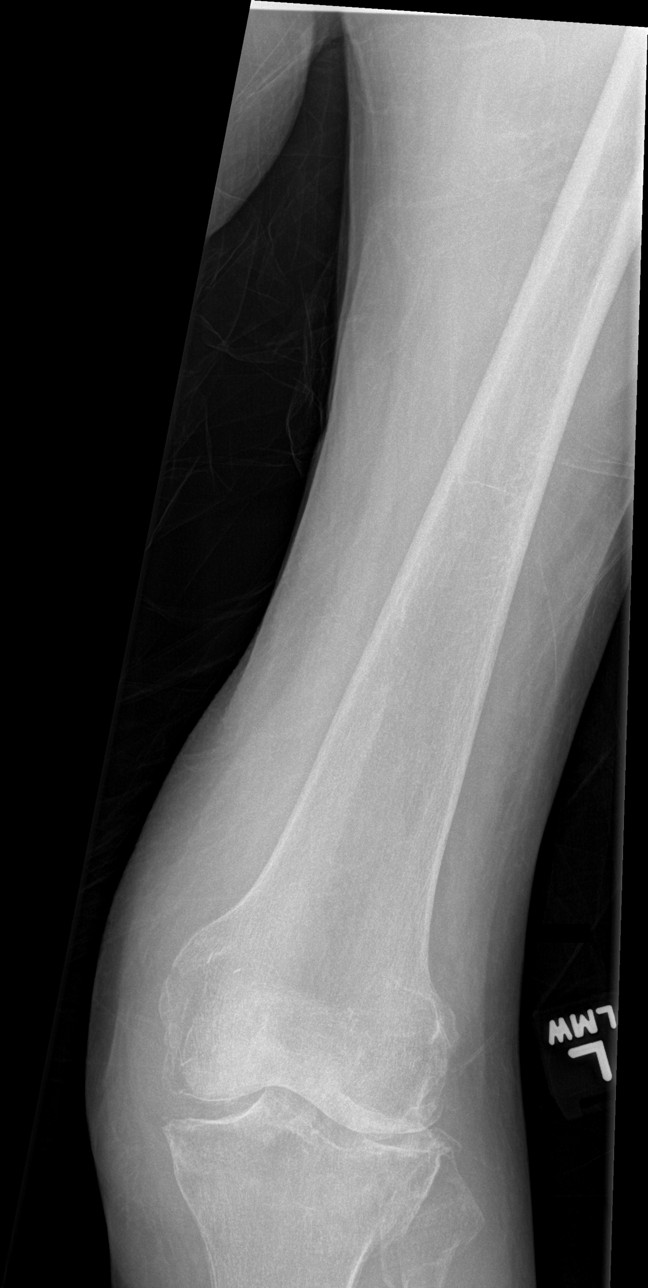
[im 3/5]
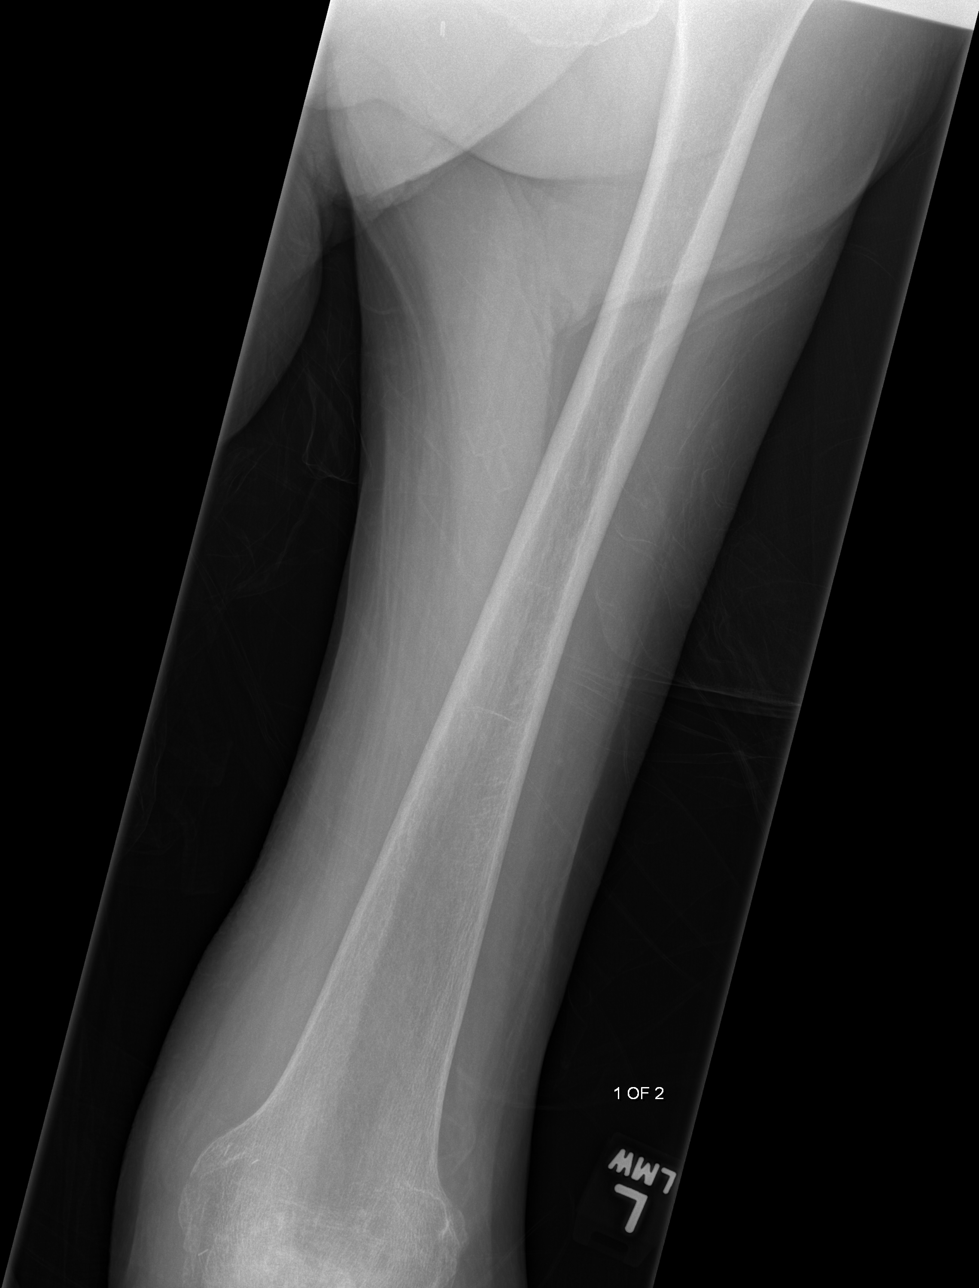
[im 4/5]
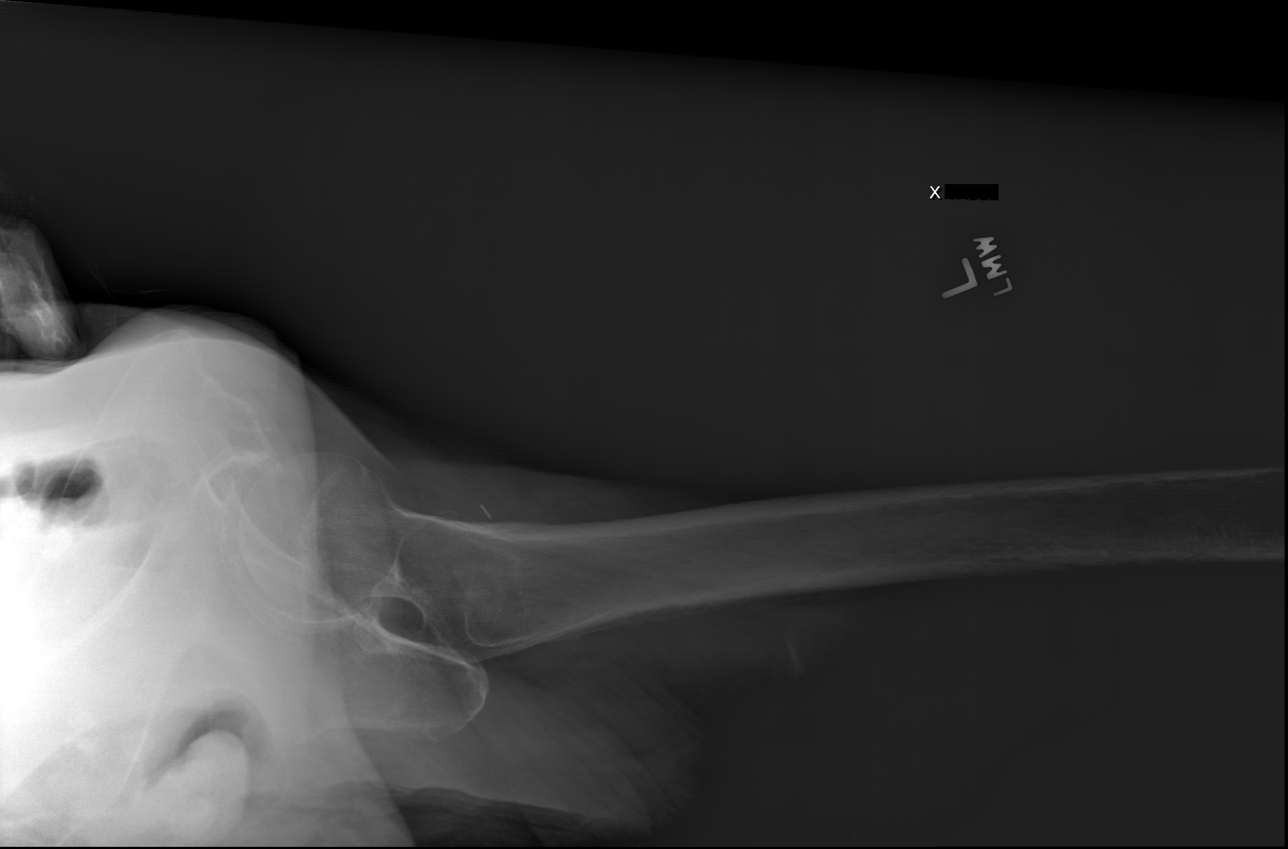
[im 5/5]
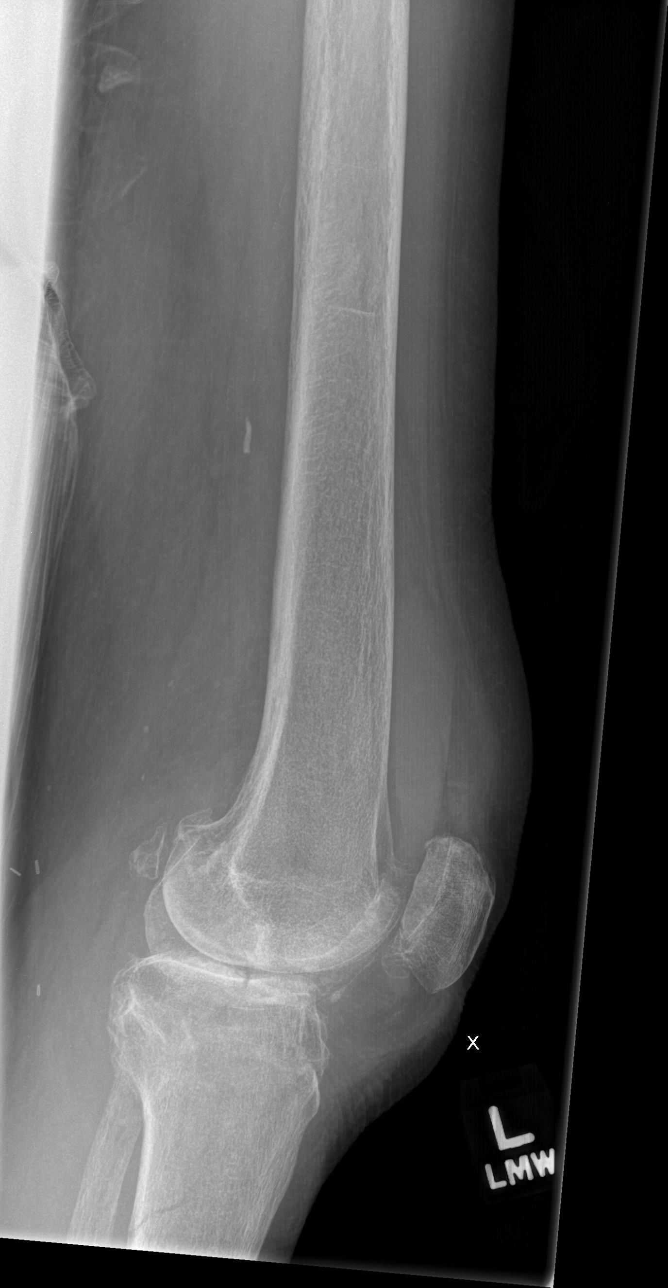

[5 of 5 positions shown; findings below may reference images not displayed]

FINDINGS: There fractures noted in the proximal tibia and fibula which are
incompletely evaluated on this exam. The femur appears intact. The
visualized portions of the pelvic ring are unremarkable. A large
knee joint effusion is noted with fat fluid level consistent with
the fractures in the proximal tibia.
IMPRESSION: No acute femoral fracture is noted. There fractures of the proximal
tibia and fibula with an associated joint effusion of the knee with
a fat fluid level.
